# Patient Record
Sex: Female | Born: 1970 | Race: White | Hispanic: No | Marital: Married | Smoking: Never smoker
Health system: Southern US, Community
[De-identification: ages and names within clinical notes are randomized; demographics above are authoritative.]

## PROBLEM LIST (undated history)

## (undated) DIAGNOSIS — Z973 Presence of spectacles and contact lenses: Secondary | ICD-10-CM

## (undated) DIAGNOSIS — K219 Gastro-esophageal reflux disease without esophagitis: Secondary | ICD-10-CM

## (undated) DIAGNOSIS — M549 Dorsalgia, unspecified: Secondary | ICD-10-CM

## (undated) DIAGNOSIS — Z87442 Personal history of urinary calculi: Secondary | ICD-10-CM

## (undated) DIAGNOSIS — N200 Calculus of kidney: Secondary | ICD-10-CM

## (undated) DIAGNOSIS — K259 Gastric ulcer, unspecified as acute or chronic, without hemorrhage or perforation: Secondary | ICD-10-CM

## (undated) HISTORY — PX: NO PAST SURGERIES: SHX2092

---

## 1998-07-01 ENCOUNTER — Other Ambulatory Visit: Admission: RE | Admit: 1998-07-01 | Discharge: 1998-07-01 | Payer: Self-pay | Admitting: Obstetrics and Gynecology

## 1999-09-06 ENCOUNTER — Other Ambulatory Visit: Admission: RE | Admit: 1999-09-06 | Discharge: 1999-09-06 | Payer: Self-pay | Admitting: Obstetrics and Gynecology

## 2000-08-13 ENCOUNTER — Encounter: Payer: Self-pay | Admitting: Obstetrics and Gynecology

## 2000-08-13 ENCOUNTER — Ambulatory Visit (HOSPITAL_COMMUNITY): Admission: RE | Admit: 2000-08-13 | Discharge: 2000-08-13 | Payer: Self-pay | Admitting: Obstetrics and Gynecology

## 2000-09-13 ENCOUNTER — Other Ambulatory Visit: Admission: RE | Admit: 2000-09-13 | Discharge: 2000-09-13 | Payer: Self-pay | Admitting: Gynecology

## 2001-07-08 ENCOUNTER — Other Ambulatory Visit: Admission: RE | Admit: 2001-07-08 | Discharge: 2001-07-08 | Payer: Self-pay | Admitting: Obstetrics and Gynecology

## 2001-08-25 ENCOUNTER — Encounter: Payer: Self-pay | Admitting: Obstetrics and Gynecology

## 2001-08-25 ENCOUNTER — Ambulatory Visit (HOSPITAL_COMMUNITY): Admission: RE | Admit: 2001-08-25 | Discharge: 2001-08-25 | Payer: Self-pay | Admitting: Obstetrics and Gynecology

## 2002-01-18 ENCOUNTER — Inpatient Hospital Stay (HOSPITAL_COMMUNITY): Admission: AD | Admit: 2002-01-18 | Discharge: 2002-01-18 | Payer: Self-pay | Admitting: Obstetrics and Gynecology

## 2002-01-19 ENCOUNTER — Inpatient Hospital Stay (HOSPITAL_COMMUNITY): Admission: AD | Admit: 2002-01-19 | Discharge: 2002-01-21 | Payer: Self-pay | Admitting: Obstetrics and Gynecology

## 2002-07-21 ENCOUNTER — Other Ambulatory Visit: Admission: RE | Admit: 2002-07-21 | Discharge: 2002-07-21 | Payer: Self-pay | Admitting: Gynecology

## 2003-07-27 ENCOUNTER — Other Ambulatory Visit: Admission: RE | Admit: 2003-07-27 | Discharge: 2003-07-27 | Payer: Self-pay | Admitting: Gynecology

## 2004-07-31 ENCOUNTER — Other Ambulatory Visit: Admission: RE | Admit: 2004-07-31 | Discharge: 2004-07-31 | Payer: Self-pay | Admitting: Gynecology

## 2005-05-25 ENCOUNTER — Ambulatory Visit: Payer: Self-pay | Admitting: Gastroenterology

## 2005-06-22 ENCOUNTER — Ambulatory Visit: Payer: Self-pay | Admitting: Gastroenterology

## 2005-08-01 ENCOUNTER — Other Ambulatory Visit: Admission: RE | Admit: 2005-08-01 | Discharge: 2005-08-01 | Payer: Self-pay | Admitting: Gynecology

## 2006-09-30 ENCOUNTER — Inpatient Hospital Stay (HOSPITAL_COMMUNITY): Admission: AD | Admit: 2006-09-30 | Discharge: 2006-09-30 | Payer: Self-pay | Admitting: Obstetrics and Gynecology

## 2006-11-07 ENCOUNTER — Inpatient Hospital Stay (HOSPITAL_COMMUNITY): Admission: AD | Admit: 2006-11-07 | Discharge: 2006-11-09 | Payer: Self-pay | Admitting: Obstetrics and Gynecology

## 2008-01-04 ENCOUNTER — Emergency Department (HOSPITAL_COMMUNITY): Admission: EM | Admit: 2008-01-04 | Discharge: 2008-01-04 | Payer: Self-pay | Admitting: Emergency Medicine

## 2011-03-30 NOTE — Discharge Summary (Signed)
[facility]  Patient:    [name], [name] Visit Number: [id number] MRN: [id number]          Service Type: OBS Location: 910A [id number] [id number] Attending Physician:  Malon Kindle Dictated by:   Zenaida Niece, M.D. Admit Date:  01/19/2002 Discharge Date: 01/21/2002                             Discharge Summary  ADMISSION DIAGNOSIS:          Intrauterine pregnancy at 39 weeks.  DISCHARGE DIAGNOSES:          Intrauterine pregnancy at 39 weeks.  PROCEDURE:                    Spontaneous vaginal delivery.  COMPLICATIONS:                None.  CONSULTATIONS:                None.  HISTORY AND PHYSICAL:         This is a 40 year old white female, gravida 1, para 0, with an EG of 39 weeks with a due date of March 12 by an LMP consistent with a 9-week ultrasound.  She presents with regular contractions. She was scheduled for induction later on the day of admission.  When she presented she was not in active labor, but was uncomfortable with her contractions and requested analgesia.  She was admitted and given pain medication.  Prenatal care essentially uncomplicated.  PRENATAL LABS:                Blood type B positive, with negative antibody screen.  RPR nonreactive.  Rubella equivocal.  Hepatitis B surface antigen negative.  HIV negative.  Gonorrhea and chlamydia negative.  Triple screen normal.  Glucola 116.  Group B strep negative.  ALLERGIES:                    PENICILLIN, SULFA.  Intolerance to NSAIDs.  PAST MEDICAL HISTORY:         History of peptic ulcer disease.  PHYSICAL EXAMINATION:  GENERAL:                      She was afebrile with stable vital signs.  ABDOMEN:                      Fundal height 37 cm on January 13, 2002; soft and nontender.  Fetal heart tracing normal.  VAGINAL:                      (per the nurse) 2 cm dilated and 70% effaced.  HOSPITAL COURSE:              The patient was admitted with regular contractions  and uncomfortable; given IV Stadol.  Her contractions spaced out and she was started on Pitocin.  I then saw her on the morning of January 19, 2002 and she was 2-90-0; amniotomy revealed clear fluid.  She was continued on her Pitocin and progressed to complete and pushed well.  On the afternoon of January 19, 2002 she had a vaginal delivery of a viable female infant; with Apgars of 9 and 9 and weighing 6 pounds 9 ounces over a second-degree laceration.  Placenta delivered spontaneously and was intact.  Her second-degree laceration was repaired with 3-0 Vicryl.  Estimated blood loss was less than 500 cc.  A few hours after delivery she had an episode of left flank pain, associated with a temperature to 101.4.  A catheter UA was negative and she had no other fevers, and her pain resolved.  She remained afebrile the rest of her stay without complications.  On the morning of postpartum day #2 she was stable for discharge home.  DISCHARGE INSTRUCTIONS:  DIET:                         Regular.  ACTIVITY:                     Pelvic rest.  FOLLOW-UP:                    Four to six weeks.  MEDICATIONS:                  Percocet p.r.n. pain.  She is given discharge pamphlet. Dictated by:   Zenaida Niece, M.D. Attending Physician:  Malon Kindle DD:  01/21/02 TD:  01/23/02 Job: [id number]

## 2011-03-30 NOTE — Discharge Summary (Signed)
NAMEGUDELIA, EUGENE NO.:  [id number]   MEDICAL RECORD NO.:  [id number]          PATIENT TYPE:  INP   LOCATION:  9108                          FACILITY:  [facility]   PHYSICIAN:  Malachi Pro. Ambrose Mantle, M.D. DATE OF BIRTH:  September 03, 1971   DATE OF ADMISSION:  11/07/2006  DATE OF DISCHARGE:  11/09/2006                               DISCHARGE SUMMARY   This is a 40 year old white female para 1-0-0-1 gravida 2, estimated  gestational age 83+ weeks by 9-week ultrasound with [facility] November 19, 2005  presented complaining of regular contractions, no vaginal bleeding or  ruptured membranes. Evaluated in maternity admission unit. The cervix  changed from 3 cm to 4 cm over a couple of hours. Blood group and type  B+ with a negative antibody, RPR nonreactive, rubella immune, hepatitis  B surface antigen negative, GC and chlamydia negative.  First trimester  screen normal, AFP normal.  1-hour Glucola 134.  Group B strep negative.  Prenatal care showed the patient had a vanishing twin, marginal previa  that resolved.   OBSTETRIC HISTORY:  In 2003 the patient was spontaneous vaginal delivery  of 39 weeks delivered a 6 pounds 9 ounces infant, no complications.   PAST MEDICAL HISTORY:  Peptic ulcer disease and gastroesophageal reflux  disorder.  No surgical history.   ALLERGIES:  PENICILLIN and SULFA causes hives.   MEDICATIONS:  None.   PHYSICAL EXAM:  VITAL SIGNS:  The patient was afebrile with normal vital  signs.  Fetal heart tones were reactive.  Contractions every 3-5  minutes. Estimated fetal weight was about 7 pounds by the RN exam.  PELVIC:  The cervix was 4 cm, 85%, vertex at a minus two.   HOSPITAL COURSE:  At 11:15 a.m. the patient was comfortable with an  epidural.  Cervix was 5 cm, 80%, vertex at a -1.  Artificial rupture of  membranes produced clear fluid.  The patient progressed to complete  dilatation and pushed well.  She had a spontaneous vaginal delivery of a  living  female infant weight 6 pounds 5 ounces, Apgars of 8 at 1 and 9 at  5 minutes.  Placenta was spontaneous and intact.  Cord blood collection  was done, second-degree laceration repaired with 3-0 Vicryl.  Blood loss  less than 500 mL.  Postpartum the patient did well and was discharged on  the second postpartum day.  Initial hemoglobin was 11.3, hematocrit  33.5, white count 12,200, platelet count 253,000.  Follow-up hemoglobin  10.3, hematocrit 30.5, white count 12,500, platelet count 211,000.  RPR  was nonreactive.   FINAL DIAGNOSES:  Intrauterine pregnancy 38+ weeks, delivered vertex.   OPERATION:  Spontaneous delivery vertex, repair of second-degree  laceration.   FINAL CONDITION:  Improved.   INSTRUCTIONS:  Included our regular discharge instruction booklet.  Percocet 5/325 24 tablets one every 4-6 hours as needed for pain is  given at discharge and the patient is advised to return to the office in  6 weeks for follow-up examination.      Malachi Pro. Ambrose Mantle, M.D.  Electronically Signed     TFH/MEDQ  D:  11/09/2006  T:  11/09/2006  Job:  [id number]

## 2011-08-06 LAB — URINALYSIS, ROUTINE W REFLEX MICROSCOPIC
Bilirubin Urine: NEGATIVE
Glucose, UA: NEGATIVE
Ketones, ur: 15 — AB
Nitrite: NEGATIVE
Protein, ur: 30 — AB
Specific Gravity, Urine: 1.019
Urobilinogen, UA: 0.2
pH: 6.5

## 2011-08-06 LAB — URINE MICROSCOPIC-ADD ON

## 2012-02-21 ENCOUNTER — Other Ambulatory Visit: Payer: Self-pay | Admitting: Obstetrics and Gynecology

## 2012-02-21 DIAGNOSIS — N63 Unspecified lump in unspecified breast: Secondary | ICD-10-CM

## 2012-02-22 ENCOUNTER — Ambulatory Visit
Admission: RE | Admit: 2012-02-22 | Discharge: 2012-02-22 | Disposition: A | Discharge status: Home / Self Care | Payer: 59 | Source: Ambulatory Visit | Attending: Obstetrics and Gynecology | Admitting: Obstetrics and Gynecology

## 2012-02-22 DIAGNOSIS — N63 Unspecified lump in unspecified breast: Secondary | ICD-10-CM

## 2013-02-18 ENCOUNTER — Encounter (HOSPITAL_COMMUNITY): Payer: Self-pay

## 2013-02-18 ENCOUNTER — Emergency Department (HOSPITAL_COMMUNITY)
Admission: EM | Admit: 2013-02-18 | Discharge: 2013-02-18 | Disposition: A | Discharge status: Home / Self Care | Payer: PRIVATE HEALTH INSURANCE | Attending: Emergency Medicine | Admitting: Emergency Medicine

## 2013-02-18 DIAGNOSIS — Z79899 Other long term (current) drug therapy: Secondary | ICD-10-CM | POA: Insufficient documentation

## 2013-02-18 DIAGNOSIS — Z87442 Personal history of urinary calculi: Secondary | ICD-10-CM | POA: Insufficient documentation

## 2013-02-18 DIAGNOSIS — M5430 Sciatica, unspecified side: Secondary | ICD-10-CM | POA: Insufficient documentation

## 2013-02-18 DIAGNOSIS — K259 Gastric ulcer, unspecified as acute or chronic, without hemorrhage or perforation: Secondary | ICD-10-CM | POA: Insufficient documentation

## 2013-02-18 DIAGNOSIS — M545 Low back pain, unspecified: Secondary | ICD-10-CM | POA: Insufficient documentation

## 2013-02-18 DIAGNOSIS — M549 Dorsalgia, unspecified: Secondary | ICD-10-CM

## 2013-02-18 HISTORY — DX: Dorsalgia, unspecified: M54.9

## 2013-02-18 HISTORY — DX: Calculus of kidney: N20.0

## 2013-02-18 HISTORY — DX: Gastric ulcer, unspecified as acute or chronic, without hemorrhage or perforation: K25.9

## 2013-02-18 MED ORDER — HYDROMORPHONE HCL PF 1 MG/ML IJ SOLN
1.0000 mg | Freq: Once | INTRAMUSCULAR | Status: AC
  Administered 2013-02-18: 1 mg via INTRAVENOUS
  Filled 2013-02-18: qty 1

## 2013-02-18 MED ORDER — DIAZEPAM 5 MG/ML IJ SOLN
5.0000 mg | Freq: Once | INTRAMUSCULAR | Status: AC
  Administered 2013-02-18: 5 mg via INTRAVENOUS
  Filled 2013-02-18: qty 2

## 2013-02-18 MED ORDER — OXYCODONE HCL 5 MG PO TABS: 5.0000 mg | ORAL_TABLET | ORAL | Status: DC | PRN

## 2013-02-18 MED ORDER — KETOROLAC TROMETHAMINE 60 MG/2ML IM SOLN
60.0000 mg | Freq: Once | INTRAMUSCULAR | Status: AC
  Administered 2013-02-18: 60 mg via INTRAMUSCULAR
  Filled 2013-02-18: qty 2

## 2013-02-18 NOTE — ED Notes (Signed)
Reports pain to mid lower back that radiates down right leg. Denies any numbness or tingling to RLE but does report some tingling to right foot. Denies any bowel or bladder incontinence.   Patient was seen by orthopedic PA 1 week ago. X-rays negative. Rx prednisone, muscle relaxer and pain medication. Was told that if sx did not get better then they would do a MRI.   Patient has taken steroid and muscle relaxer but did not take the pain medication. States that "I didn't want it to hurt my stomach."

## 2013-02-18 NOTE — ED Notes (Signed)
PA at bedside.

## 2013-02-18 NOTE — ED Notes (Signed)
Patient presents with c/o lower back pain that radiates down right leg > 1 week. Was seen by PCP and dx sciatica. Completed prednisone taper Monday. Reports taking prescribed pain medication with no relief in sx

## 2013-02-18 NOTE — ED Notes (Signed)
Pt able to ambulate with limp. States she has shooting pain down her right leg with weightbearing.

## 2013-02-18 NOTE — ED Provider Notes (Signed)
History     CSN: [id number]  Arrival date & time 02/18/13  4098   First MD Initiated Contact with Patient 02/18/13 0602      Chief Complaint  Patient presents with  . Back Pain    (Consider location/radiation/quality/duration/timing/severity/associated sxs/prior treatment) HPI Comments: Patient is a 42 year old female who presents with sudden onset of lower back pain that started about 10 days ago. The pain started when she bent over to pick something up off the floor. The pain is sharp and severe and radiates down her right leg. The pain is constant. Movement makes the pain worse. Patient reports needing help ambulating due to pain which is not normal for her. Nothing makes the pain better. Patient was seen at [facility] for her pain 1 week ago where she was diagnosed with sciatica and given hydrocodone, prednisone, and robaxin. Patient did not take the hydrocodone because it causes stomach upset. Patient has tried Robaxin and prednisone taper for pain without relief. No associated symptoms. No saddles paresthesias or bladder/bowel incontinence.      Past Medical History  Diagnosis Date  . Stomach ulcer   . Kidney stone   . Back pain     History reviewed. No pertinent past surgical history.  No family history on file.  History  Substance Use Topics  . Smoking status: Never Smoker   . Smokeless tobacco: Never Used  . Alcohol Use: Yes     Comment: occasionally     OB History   Grav Para Term Preterm Abortions TAB SAB Ect Mult Living                  Review of Systems  Musculoskeletal: Positive for back pain.  All other systems reviewed and are negative.    Allergies  Penicillins and Sulfa antibiotics  Home Medications   Current Outpatient Rx  Name  Route  Sig  Dispense  Refill  . famotidine (PEPCID) 20 MG tablet   Oral   Take 20 mg by mouth 2 (two) times daily as needed for heartburn.         . methocarbamol (ROBAXIN) 500 MG tablet   Oral  Take 500 mg by mouth 4 (four) times daily as needed (muscle spasm).         . norethindrone (MICRONOR,CAMILA,ERRIN) 0.35 MG tablet   Oral   Take 1 tablet by mouth daily.           BP 110/68  Pulse 66  Temp(Src) 97.1 F (36.2 C) (Oral)  Resp 18  SpO2 96%  LMP 01/28/2013  Physical Exam  Nursing note and vitals reviewed. Constitutional: She is oriented to person, place, and time. She appears well-developed and well-nourished. No distress.  HENT:  Head: Normocephalic and atraumatic.  Eyes: Conjunctivae are normal.  Neck: Normal range of motion.  Cardiovascular: Normal rate and regular rhythm.  Exam reveals no gallop and no friction rub.   No murmur heard. Pulmonary/Chest: Effort normal and breath sounds normal. She has no wheezes. She has no rales. She exhibits no tenderness.  Abdominal: Soft. She exhibits no distension. There is no tenderness. There is no rebound.  Musculoskeletal: Normal range of motion.  Right gluteal tenderness to palpation. No obvious deformity or bruising noted. Full of ROM of right hip, knee and ankle. No midline spine tenderness to palpation. Patient has difficulty ambulating due to pain.    Neurological: She is alert and oriented to person, place, and time. Coordination normal.  Speech is  goal-oriented. Moves limbs without ataxia.   Skin: Skin is warm and dry.  Psychiatric: She has a normal mood and affect. Her behavior is normal.    ED Course  Procedures (including critical care time)  Labs Reviewed - No data to display No results found.   1. Back pain       MDM  6:17 AM Patient will have IM toradol and valium for pain. No bladder/bowel incontinence or saddle paresthesias. No signs of neurovascular compromise.   7:50 AM Patient reports minimal pain relief. I will give the patient dilaudid.   8:25 AM Patient reports pain relief with dilaudid. She is able to ambulate better with pain relief. I will discharge the patient with oxycodone  and instructions to follow up with [facility] for MRI and further evaluation and management. Patient instructed to return to the ED with worsening or concerning symptoms. Plan discussed with patient who is agreeable.      Emilia Beck, PA-C 02/18/13 0830

## 2013-02-18 NOTE — ED Provider Notes (Signed)
Medical screening examination/treatment/procedure(s) were performed by non-physician practitioner and as supervising physician I was immediately available for consultation/collaboration.   Dione Booze, MD 02/18/13 [phone]

## 2013-02-19 ENCOUNTER — Other Ambulatory Visit: Payer: Self-pay | Admitting: Surgical

## 2013-02-19 ENCOUNTER — Encounter (HOSPITAL_COMMUNITY): Payer: Self-pay | Admitting: *Deleted

## 2013-02-19 ENCOUNTER — Inpatient Hospital Stay (HOSPITAL_COMMUNITY): Payer: PRIVATE HEALTH INSURANCE

## 2013-02-19 ENCOUNTER — Observation Stay (HOSPITAL_COMMUNITY)
Admission: AD | Admit: 2013-02-19 | Discharge: 2013-02-21 | Disposition: A | Discharge status: Home / Self Care | Payer: PRIVATE HEALTH INSURANCE | Source: Ambulatory Visit | Attending: Orthopedic Surgery | Admitting: Orthopedic Surgery

## 2013-02-19 DIAGNOSIS — M5126 Other intervertebral disc displacement, lumbar region: Principal | ICD-10-CM | POA: Diagnosis present

## 2013-02-19 DIAGNOSIS — M48061 Spinal stenosis, lumbar region without neurogenic claudication: Secondary | ICD-10-CM | POA: Diagnosis present

## 2013-02-19 LAB — CBC WITH DIFFERENTIAL/PLATELET
Basophils Absolute: 0 10*3/uL (ref 0.0–0.1)
Basophils Relative: 0 % (ref 0–1)
Eosinophils Absolute: 0.1 10*3/uL (ref 0.0–0.7)
Eosinophils Relative: 1 % (ref 0–5)
HCT: 42.5 % (ref 36.0–46.0)
Hemoglobin: 14.5 g/dL (ref 12.0–15.0)
Lymphocytes Relative: 31 % (ref 12–46)
Lymphs Abs: 4.8 10*3/uL — ABNORMAL HIGH (ref 0.7–4.0)
MCH: 31.7 pg (ref 26.0–34.0)
MCHC: 34.1 g/dL (ref 30.0–36.0)
MCV: 92.8 fL (ref 78.0–100.0)
Monocytes Absolute: 1.4 10*3/uL — ABNORMAL HIGH (ref 0.1–1.0)
Monocytes Relative: 9 % (ref 3–12)
Neutro Abs: 9.1 10*3/uL — ABNORMAL HIGH (ref 1.7–7.7)
Neutrophils Relative %: 59 % (ref 43–77)
Platelets: 251 10*3/uL (ref 150–400)
RBC: 4.58 MIL/uL (ref 3.87–5.11)
RDW: 12.5 % (ref 11.5–15.5)
WBC: 15.4 10*3/uL — ABNORMAL HIGH (ref 4.0–10.5)

## 2013-02-19 LAB — COMPREHENSIVE METABOLIC PANEL
ALT: 11 U/L (ref 0–35)
AST: 14 U/L (ref 0–37)
Albumin: 3.4 g/dL — ABNORMAL LOW (ref 3.5–5.2)
Alkaline Phosphatase: 56 U/L (ref 39–117)
BUN: 10 mg/dL (ref 6–23)
CO2: 29 mEq/L (ref 19–32)
Calcium: 8.3 mg/dL — ABNORMAL LOW (ref 8.4–10.5)
Chloride: 98 mEq/L (ref 96–112)
Creatinine, Ser: 0.67 mg/dL (ref 0.50–1.10)
GFR calc Af Amer: >90 mL/min (ref >90)
GFR calc non Af Amer: >90 mL/min (ref >90)
Glucose, Bld: 109 mg/dL — ABNORMAL HIGH (ref 70–99)
Potassium: 3.8 mEq/L (ref 3.5–5.1)
Sodium: 135 mEq/L (ref 135–145)
Total Bilirubin: 0.3 mg/dL (ref 0.3–1.2)
Total Protein: 6.1 g/dL (ref 6.0–8.3)

## 2013-02-19 LAB — URINALYSIS, ROUTINE W REFLEX MICROSCOPIC
Bilirubin Urine: NEGATIVE
Glucose, UA: NEGATIVE mg/dL
Hgb urine dipstick: NEGATIVE
Ketones, ur: NEGATIVE mg/dL
Leukocytes, UA: NEGATIVE
Nitrite: NEGATIVE
Protein, ur: NEGATIVE mg/dL
Specific Gravity, Urine: 1.027 (ref 1.005–1.030)
Urobilinogen, UA: 0.2 mg/dL (ref 0.0–1.0)
pH: 5.5 (ref 5.0–8.0)

## 2013-02-19 LAB — PROTIME-INR
INR: 0.96 (ref 0.00–1.49)
Prothrombin Time: 12.7 seconds (ref 11.6–15.2)

## 2013-02-19 LAB — APTT: aPTT: 28 seconds (ref 24–37)

## 2013-02-19 MED ORDER — ONDANSETRON HCL 4 MG PO TABS
4.0000 mg | ORAL_TABLET | Freq: Four times a day (QID) | ORAL | Status: DC | PRN
  Administered 2013-02-19: 4 mg via ORAL
  Filled 2013-02-19: qty 1

## 2013-02-19 MED ORDER — FLEET ENEMA 7-19 GM/118ML RE ENEM: 1.0000 | ENEMA | Freq: Once | RECTAL | Status: AC | PRN

## 2013-02-19 MED ORDER — FAMOTIDINE 20 MG PO TABS
20.0000 mg | ORAL_TABLET | Freq: Two times a day (BID) | ORAL | Status: DC | PRN
  Filled 2013-02-19: qty 1

## 2013-02-19 MED ORDER — CLINDAMYCIN PHOSPHATE 900 MG/50ML IV SOLN
900.0000 mg | INTRAVENOUS | Status: AC
  Administered 2013-02-20: 900 mg via INTRAVENOUS
  Filled 2013-02-19: qty 50

## 2013-02-19 MED ORDER — NORETHINDRONE 0.35 MG PO TABS: 1.0000 | ORAL_TABLET | Freq: Every day | ORAL | Status: DC

## 2013-02-19 MED ORDER — SODIUM CHLORIDE 0.9 % IV SOLN
INTRAVENOUS | Status: DC
  Administered 2013-02-19 – 2013-02-20 (×2): via INTRAVENOUS

## 2013-02-19 MED ORDER — SODIUM CHLORIDE 0.9 % IV SOLN: 250.0000 mL | INTRAVENOUS | Status: DC | PRN

## 2013-02-19 MED ORDER — POLYETHYLENE GLYCOL 3350 17 G PO PACK: 17.0000 g | PACK | Freq: Every day | ORAL | Status: DC | PRN

## 2013-02-19 MED ORDER — SODIUM CHLORIDE 0.9 % IJ SOLN: 3.0000 mL | INTRAMUSCULAR | Status: DC | PRN

## 2013-02-19 MED ORDER — FAMOTIDINE 20 MG PO TABS
20.0000 mg | ORAL_TABLET | Freq: Two times a day (BID) | ORAL | Status: DC
  Administered 2013-02-19 – 2013-02-20 (×2): 20 mg via ORAL
  Filled 2013-02-19 (×3): qty 1

## 2013-02-19 MED ORDER — OXYCODONE HCL 5 MG PO TABS
5.0000 mg | ORAL_TABLET | ORAL | Status: DC | PRN
  Administered 2013-02-19 – 2013-02-20 (×2): 10 mg via ORAL
  Filled 2013-02-19 (×3): qty 2

## 2013-02-19 MED ORDER — SODIUM CHLORIDE 0.9 % IJ SOLN
3.0000 mL | Freq: Two times a day (BID) | INTRAMUSCULAR | Status: DC
  Administered 2013-02-19: 3 mL via INTRAVENOUS

## 2013-02-19 MED ORDER — BISACODYL 10 MG RE SUPP: 10.0000 mg | Freq: Every day | RECTAL | Status: DC | PRN

## 2013-02-19 MED ORDER — METHOCARBAMOL 500 MG PO TABS
500.0000 mg | ORAL_TABLET | Freq: Four times a day (QID) | ORAL | Status: DC | PRN
  Filled 2013-02-19: qty 1

## 2013-02-19 MED ORDER — ONDANSETRON HCL 4 MG/2ML IJ SOLN: 4.0000 mg | Freq: Four times a day (QID) | INTRAMUSCULAR | Status: DC | PRN

## 2013-02-19 MED ORDER — HYDROMORPHONE HCL PF 1 MG/ML IJ SOLN
1.0000 mg | INTRAMUSCULAR | Status: DC | PRN
  Administered 2013-02-19: 1 mg via INTRAVENOUS
  Filled 2013-02-19: qty 1

## 2013-02-19 MED ORDER — ACETAMINOPHEN 325 MG PO TABS
650.0000 mg | ORAL_TABLET | Freq: Four times a day (QID) | ORAL | Status: DC | PRN
  Administered 2013-02-20: 650 mg via ORAL
  Filled 2013-02-19: qty 2

## 2013-02-19 MED ORDER — ACETAMINOPHEN 650 MG RE SUPP: 650.0000 mg | Freq: Four times a day (QID) | RECTAL | Status: DC | PRN

## 2013-02-19 NOTE — H&P (Signed)
[name] is an 42 y.o. female.   Chief Complaint: back pain HPI: She actually was shoveling her driveway six weeks ago when we had the snow and she did injure her lower back on the right. She then subsequently noted some improvement and was doing fairly well until about a week ago when she started helping a friend move a desk and she felt pain shooting down her right posterior buttock and thigh down to just above the knee. Since that time, this pain has been fairly significant for her. She really has not noted a lot of improvement. She states she has mild groin pain on the right again but this has not been a significant issue. She overall has really no other radicular symptoms. No weakness in the lower extremities. She has been using Ibuprofen as needed as well as a heating pad only. She has not had any X-rays at this point in time. No real back issues in her past. She is mainly concerned about the fact this really does not seem to be alleviating or getting any better with treatments she has performed thus far. MRI revealed large herniated disc at L5-S1 on the right.   Past Medical History  Diagnosis Date  . Stomach ulcer   . Kidney stone   . Back pain    No pertinent surgical history  Social History:  reports that she has never smoked. She has never used smokeless tobacco. She reports that  drinks alcohol. She reports that she does not use illicit drugs.  Allergies:  Allergies  Allergen Reactions  . Penicillins Hives  . Sulfa Antibiotics Hives   Current outpatient prescriptions: famotidine (PEPCID) 20 MG tablet, Take 20 mg by mouth 2 (two) times daily as needed for heartburn., Disp: , Rfl: ;   methocarbamol (ROBAXIN) 500 MG tablet, Take 500 mg by mouth 4 (four) times daily as needed (muscle spasm)., Disp: , Rfl: ;   norethindrone (MICRONOR,CAMILA,ERRIN) 0.35 MG tablet, Take 1 tablet by mouth daily., Disp: , Rfl:  oxyCODONE (ROXICODONE) 5 MG immediate release tablet, Take 1 tablet (5 mg  total) by mouth every 4 (four) hours as needed for pain., Disp: 15 tablet, Rfl: 0  Review of Systems  Constitutional: Negative.   HENT: Negative.  Negative for neck pain.   Eyes: Negative.   Respiratory: Negative.   Cardiovascular: Negative.   Gastrointestinal: Negative.   Genitourinary: Negative.   Musculoskeletal: Positive for back pain. Negative for myalgias, joint pain and falls.  Skin: Negative.   Neurological: Negative.   Endo/Heme/Allergies: Negative.   Psychiatric/Behavioral: Negative.     Last menstrual period 01/28/2013. Physical Exam  Constitutional: She is oriented to person, place, and time. She appears well-developed and well-nourished. No distress.  HENT:  Head: Normocephalic and atraumatic.  Right Ear: External ear normal.  Left Ear: External ear normal.  Nose: Nose normal.  Mouth/Throat: Oropharynx is clear and moist.  Eyes: Conjunctivae and EOM are normal.  Neck: Normal range of motion. Neck supple. No tracheal deviation present. No thyromegaly present.  Cardiovascular: Normal rate, regular rhythm and intact distal pulses.   No murmur heard. Respiratory: Effort normal and breath sounds normal. No respiratory distress. She has no wheezes. She exhibits no tenderness.  GI: Soft. Bowel sounds are normal. She exhibits no distension and no mass. There is no tenderness.  Musculoskeletal:       Right hip: Normal.       Left hip: Normal.       Right knee: Normal.  Left knee: Normal.       Lumbar back: She exhibits decreased range of motion, tenderness, pain and spasm.       Right lower leg: She exhibits no tenderness and no swelling.       Left lower leg: She exhibits no tenderness and no swelling.  Lymphadenopathy:    She has no cervical adenopathy.  Neurological: She is alert and oriented to person, place, and time. No sensory deficit.  Complete neurological exam unable to be perform due to patient level of discomfort  Skin: No rash noted. She is not  diaphoretic. No erythema.  Psychiatric: She has a normal mood and affect. Her behavior is normal.     Assessment/Plan Lumbar disc herniation L5-S1, right She needs a lumbar microdiscectomy. She will be admitted overnight before the surgery for pain control. The possible complications of spinal surgery number one could be infection, which is extremely rare. We do use antibiotics prior to the surgery and during surgery and after surgery. Number two is always a slight degree of probability that you could develop a blood clot in your leg after any type of surgery and we try our best to prevent that with aspirin post op when it is safe to begin. The third is a dural leak. That is the spinal fluid leak that could occur. At certain rare times the bone or the disc could literally stick to the dura which is the lining which contains the spinal fluid and we could develop a small tear in that lining which we then patch up. That is an extremely rare complication. The last and final complication is a recurrent disc rupture. That means that you could rupture another small piece of disc later on down the road and there is about a 2% chance of that.      [address], PA-C  [place], Alp [place] LAUREN 02/19/2013, 5:52 PM

## 2013-02-20 ENCOUNTER — Inpatient Hospital Stay (HOSPITAL_COMMUNITY): Payer: PRIVATE HEALTH INSURANCE

## 2013-02-20 ENCOUNTER — Encounter (HOSPITAL_COMMUNITY)
Admission: AD | Disposition: A | Discharge status: Home / Self Care | Payer: Self-pay | Source: Ambulatory Visit | Attending: Orthopedic Surgery

## 2013-02-20 ENCOUNTER — Encounter (HOSPITAL_COMMUNITY): Payer: Self-pay | Admitting: Registered Nurse

## 2013-02-20 ENCOUNTER — Inpatient Hospital Stay (HOSPITAL_COMMUNITY): Payer: PRIVATE HEALTH INSURANCE | Admitting: Anesthesiology

## 2013-02-20 ENCOUNTER — Inpatient Hospital Stay: Admit: 2013-02-20 | Payer: Self-pay | Admitting: Orthopedic Surgery

## 2013-02-20 ENCOUNTER — Encounter (HOSPITAL_COMMUNITY): Payer: Self-pay | Admitting: Anesthesiology

## 2013-02-20 DIAGNOSIS — M5126 Other intervertebral disc displacement, lumbar region: Secondary | ICD-10-CM | POA: Diagnosis present

## 2013-02-20 DIAGNOSIS — M48061 Spinal stenosis, lumbar region without neurogenic claudication: Secondary | ICD-10-CM | POA: Diagnosis present

## 2013-02-20 HISTORY — PX: LUMBAR LAMINECTOMY: SHX95

## 2013-02-20 SURGERY — MICRODISCECTOMY LUMBAR LAMINECTOMY
Anesthesia: General | Site: Back | Laterality: Right | Wound class: Clean

## 2013-02-20 MED ORDER — METHOCARBAMOL 500 MG PO TABS: 500.0000 mg | ORAL_TABLET | Freq: Four times a day (QID) | ORAL | Status: DC | PRN

## 2013-02-20 MED ORDER — EPHEDRINE SULFATE 50 MG/ML IJ SOLN
INTRAMUSCULAR | Status: DC | PRN
  Administered 2013-02-20: 5 mg via INTRAVENOUS

## 2013-02-20 MED ORDER — ONDANSETRON HCL 4 MG/2ML IJ SOLN: 4.0000 mg | INTRAMUSCULAR | Status: DC | PRN

## 2013-02-20 MED ORDER — LACTATED RINGERS IV SOLN: INTRAVENOUS | Status: DC

## 2013-02-20 MED ORDER — ACETAMINOPHEN 10 MG/ML IV SOLN: 1000.0000 mg | Freq: Once | INTRAVENOUS | Status: DC | PRN

## 2013-02-20 MED ORDER — OXYCODONE-ACETAMINOPHEN 5-325 MG PO TABS
1.0000 | ORAL_TABLET | ORAL | Status: DC | PRN
  Administered 2013-02-21 (×2): 1 via ORAL
  Filled 2013-02-20 (×2): qty 1

## 2013-02-20 MED ORDER — HYDROMORPHONE HCL PF 1 MG/ML IJ SOLN: 0.5000 mg | INTRAMUSCULAR | Status: DC | PRN

## 2013-02-20 MED ORDER — METOCLOPRAMIDE HCL 5 MG/ML IJ SOLN
INTRAMUSCULAR | Status: DC | PRN
  Administered 2013-02-20: 5 mg via INTRAVENOUS

## 2013-02-20 MED ORDER — BISACODYL 10 MG RE SUPP: 10.0000 mg | Freq: Every day | RECTAL | Status: DC | PRN

## 2013-02-20 MED ORDER — DEXAMETHASONE SODIUM PHOSPHATE 10 MG/ML IJ SOLN
INTRAMUSCULAR | Status: DC | PRN
  Administered 2013-02-20: 10 mg via INTRAVENOUS

## 2013-02-20 MED ORDER — NEOSTIGMINE METHYLSULFATE 1 MG/ML IJ SOLN
INTRAMUSCULAR | Status: DC | PRN
  Administered 2013-02-20: 5 mg via INTRAVENOUS

## 2013-02-20 MED ORDER — LIDOCAINE HCL (CARDIAC) 20 MG/ML IV SOLN
INTRAVENOUS | Status: DC | PRN
  Administered 2013-02-20: 80 mg via INTRAVENOUS

## 2013-02-20 MED ORDER — POLYETHYLENE GLYCOL 3350 17 G PO PACK: 17.0000 g | PACK | Freq: Every day | ORAL | Status: DC | PRN

## 2013-02-20 MED ORDER — ROCURONIUM BROMIDE 100 MG/10ML IV SOLN
INTRAVENOUS | Status: DC | PRN
  Administered 2013-02-20: 5 mg via INTRAVENOUS
  Administered 2013-02-20: 40 mg via INTRAVENOUS
  Administered 2013-02-20: 5 mg via INTRAVENOUS
  Administered 2013-02-20: 10 mg via INTRAVENOUS

## 2013-02-20 MED ORDER — METHOCARBAMOL 500 MG PO TABS
500.0000 mg | ORAL_TABLET | Freq: Four times a day (QID) | ORAL | Status: DC | PRN
  Filled 2013-02-20 (×2): qty 1

## 2013-02-20 MED ORDER — HYDROCODONE-ACETAMINOPHEN 5-325 MG PO TABS: 1.0000 | ORAL_TABLET | ORAL | Status: DC | PRN

## 2013-02-20 MED ORDER — OXYCODONE HCL 5 MG PO TABS
5.0000 mg | ORAL_TABLET | ORAL | Status: DC | PRN
Start: 2013-02-20 — End: 2017-10-18

## 2013-02-20 MED ORDER — ACETAMINOPHEN 10 MG/ML IV SOLN
INTRAVENOUS | Status: DC | PRN
  Administered 2013-02-20: 1000 mg via INTRAVENOUS

## 2013-02-20 MED ORDER — MEPERIDINE HCL 50 MG/ML IJ SOLN: 6.2500 mg | INTRAMUSCULAR | Status: DC | PRN

## 2013-02-20 MED ORDER — GLYCOPYRROLATE 0.2 MG/ML IJ SOLN
INTRAMUSCULAR | Status: DC | PRN
  Administered 2013-02-20: .2 mg via INTRAVENOUS

## 2013-02-20 MED ORDER — MIDAZOLAM HCL 5 MG/5ML IJ SOLN
INTRAMUSCULAR | Status: DC | PRN
  Administered 2013-02-20 (×2): 1 mg via INTRAVENOUS

## 2013-02-20 MED ORDER — CLINDAMYCIN PHOSPHATE 600 MG/50ML IV SOLN
600.0000 mg | Freq: Three times a day (TID) | INTRAVENOUS | Status: DC
  Administered 2013-02-20 – 2013-02-21 (×2): 600 mg via INTRAVENOUS
  Filled 2013-02-20 (×3): qty 50

## 2013-02-20 MED ORDER — FENTANYL CITRATE 0.05 MG/ML IJ SOLN
INTRAMUSCULAR | Status: DC | PRN
  Administered 2013-02-20 (×4): 50 ug via INTRAVENOUS

## 2013-02-20 MED ORDER — STERILE WATER FOR IRRIGATION IR SOLN
Status: DC | PRN
  Administered 2013-02-20: 16:00:00

## 2013-02-20 MED ORDER — PHENOL 1.4 % MT LIQD
1.0000 | OROMUCOSAL | Status: DC | PRN
  Filled 2013-02-20: qty 177

## 2013-02-20 MED ORDER — DIAZEPAM 5 MG PO TABS
5.0000 mg | ORAL_TABLET | Freq: Three times a day (TID) | ORAL | Status: DC | PRN
  Administered 2013-02-20 (×2): 5 mg via ORAL
  Filled 2013-02-20 (×2): qty 1

## 2013-02-20 MED ORDER — BACITRACIN ZINC 500 UNIT/GM EX OINT
TOPICAL_OINTMENT | CUTANEOUS | Status: DC | PRN
  Administered 2013-02-20: 1 via TOPICAL

## 2013-02-20 MED ORDER — HYDROMORPHONE HCL PF 1 MG/ML IJ SOLN
0.2500 mg | INTRAMUSCULAR | Status: DC | PRN
  Administered 2013-02-20 (×4): 0.5 mg via INTRAVENOUS

## 2013-02-20 MED ORDER — PROPOFOL 10 MG/ML IV BOLUS
INTRAVENOUS | Status: DC | PRN
  Administered 2013-02-20: 20 mg via INTRAVENOUS
  Administered 2013-02-20: 180 mg via INTRAVENOUS

## 2013-02-20 MED ORDER — BUPIVACAINE LIPOSOME 1.3 % IJ SUSP
INTRAMUSCULAR | Status: DC | PRN
  Administered 2013-02-20: 20 mL

## 2013-02-20 MED ORDER — THROMBIN 5000 UNITS EX SOLR
OROMUCOSAL | Status: DC | PRN
  Administered 2013-02-20: 16:00:00 via TOPICAL

## 2013-02-20 MED ORDER — OXYCODONE HCL 5 MG PO TABS: 5.0000 mg | ORAL_TABLET | Freq: Once | ORAL | Status: DC | PRN

## 2013-02-20 MED ORDER — MORPHINE SULFATE 15 MG PO TABS
15.0000 mg | ORAL_TABLET | Freq: Once | ORAL | Status: AC
  Administered 2013-02-20: 15 mg via ORAL
  Filled 2013-02-20: qty 1

## 2013-02-20 MED ORDER — METHOCARBAMOL 100 MG/ML IJ SOLN: 500.0000 mg | Freq: Four times a day (QID) | INTRAVENOUS | Status: DC | PRN

## 2013-02-20 MED ORDER — BUPIVACAINE LIPOSOME 1.3 % IJ SUSP
20.0000 mL | Freq: Once | INTRAMUSCULAR | Status: DC
  Filled 2013-02-20 (×2): qty 20

## 2013-02-20 MED ORDER — MORPHINE SULFATE 2 MG/ML IJ SOLN
2.0000 mg | INTRAMUSCULAR | Status: DC | PRN
  Administered 2013-02-20 (×6): 2 mg via INTRAVENOUS
  Filled 2013-02-20 (×7): qty 1

## 2013-02-20 MED ORDER — MENTHOL 3 MG MT LOZG
1.0000 | LOZENGE | OROMUCOSAL | Status: DC | PRN
  Filled 2013-02-20: qty 9

## 2013-02-20 MED ORDER — LACTATED RINGERS IV SOLN
INTRAVENOUS | Status: DC | PRN
  Administered 2013-02-20 (×2): via INTRAVENOUS

## 2013-02-20 MED ORDER — ONDANSETRON HCL 4 MG/2ML IJ SOLN
INTRAMUSCULAR | Status: DC | PRN
  Administered 2013-02-20: 4 mg via INTRAVENOUS

## 2013-02-20 MED ORDER — FLEET ENEMA 7-19 GM/118ML RE ENEM: 1.0000 | ENEMA | Freq: Once | RECTAL | Status: AC | PRN

## 2013-02-20 MED ORDER — PROMETHAZINE HCL 25 MG/ML IJ SOLN: 6.2500 mg | INTRAMUSCULAR | Status: DC | PRN

## 2013-02-20 MED ORDER — OXYCODONE HCL 5 MG/5ML PO SOLN
5.0000 mg | Freq: Once | ORAL | Status: DC | PRN
  Filled 2013-02-20: qty 5

## 2013-02-20 SURGICAL SUPPLY — 50 items
APL SKNCLS STERI-STRIP NONHPOA (GAUZE/BANDAGES/DRESSINGS) ×1
BAG SPEC THK2 15X12 ZIP CLS (MISCELLANEOUS) ×1
BAG ZIPLOCK 12X15 (MISCELLANEOUS) ×2 IMPLANT
BENZOIN TINCTURE PRP APPL 2/3 (GAUZE/BANDAGES/DRESSINGS) ×2 IMPLANT
CLEANER TIP ELECTROSURG 2X2 (MISCELLANEOUS) ×3 IMPLANT
CLOTH BEACON ORANGE TIMEOUT ST (SAFETY) ×2 IMPLANT
CONT SPECI 4OZ STER CLIK (MISCELLANEOUS) ×2 IMPLANT
CORD HIGH FREQUENCY UNIPOLAR (ELECTROSURGICAL) ×1 IMPLANT
DRAIN PENROSE 18X1/4 LTX STRL (WOUND CARE) IMPLANT
DRAPE LG THREE QUARTER DISP (DRAPES) ×2 IMPLANT
DRAPE MICROSCOPE LEICA (MISCELLANEOUS) ×2 IMPLANT
DRAPE POUCH INSTRU U-SHP 10X18 (DRAPES) ×2 IMPLANT
DRAPE SURG 17X11 SM STRL (DRAPES) ×1 IMPLANT
DRSG ADAPTIC 3X8 NADH LF (GAUZE/BANDAGES/DRESSINGS) ×2 IMPLANT
DRSG EMULSION OIL 3X16 NADH (GAUZE/BANDAGES/DRESSINGS) ×1 IMPLANT
DRSG PAD ABDOMINAL 8X10 ST (GAUZE/BANDAGES/DRESSINGS) ×4 IMPLANT
DURAPREP 26ML APPLICATOR (WOUND CARE) ×2 IMPLANT
ELECT REM PT RETURN 9FT ADLT (ELECTROSURGICAL) ×4
ELECTRODE REM PT RTRN 9FT ADLT (ELECTROSURGICAL) ×1 IMPLANT
GLOVE BIOGEL PI IND STRL 8 (GLOVE) ×1 IMPLANT
GLOVE BIOGEL PI IND STRL 8.5 (GLOVE) ×1 IMPLANT
GLOVE BIOGEL PI INDICATOR 8 (GLOVE) ×1
GLOVE BIOGEL PI INDICATOR 8.5 (GLOVE) ×1
GLOVE ECLIPSE 8.0 STRL XLNG CF (GLOVE) ×4 IMPLANT
GOWN PREVENTION PLUS LG XLONG (DISPOSABLE) ×5 IMPLANT
GOWN STRL REIN XL XLG (GOWN DISPOSABLE) ×4 IMPLANT
KIT BASIN OR (CUSTOM PROCEDURE TRAY) ×2 IMPLANT
KIT POSITIONING SURG ANDREWS (MISCELLANEOUS) ×2 IMPLANT
MANIFOLD NEPTUNE II (INSTRUMENTS) ×2 IMPLANT
NDL SPNL 18GX3.5 QUINCKE PK (NEEDLE) ×2 IMPLANT
NEEDLE SPNL 18GX3.5 QUINCKE PK (NEEDLE) ×6 IMPLANT
NS IRRIG 1000ML POUR BTL (IV SOLUTION) ×2 IMPLANT
PATTIES SURGICAL .5 X.5 (GAUZE/BANDAGES/DRESSINGS) IMPLANT
PATTIES SURGICAL .75X.75 (GAUZE/BANDAGES/DRESSINGS) IMPLANT
PATTIES SURGICAL 1X1 (DISPOSABLE) IMPLANT
PIN SAFETY NICK PLATE  2 MED (MISCELLANEOUS)
PIN SAFETY NICK PLATE 2 MED (MISCELLANEOUS) IMPLANT
POSITIONER SURGICAL ARM (MISCELLANEOUS) ×2 IMPLANT
SPONGE GAUZE 4X4 12PLY (GAUZE/BANDAGES/DRESSINGS) ×1 IMPLANT
SPONGE LAP 4X18 X RAY DECT (DISPOSABLE) ×1 IMPLANT
SPONGE SURGIFOAM ABS GEL 100 (HEMOSTASIS) ×2 IMPLANT
STAPLER VISISTAT 35W (STAPLE) IMPLANT
SUT VIC AB 0 CT1 27 (SUTURE) ×2
SUT VIC AB 0 CT1 27XBRD ANTBC (SUTURE) ×1 IMPLANT
SUT VIC AB 1 CT1 27 (SUTURE) ×8
SUT VIC AB 1 CT1 27XBRD ANTBC (SUTURE) ×4 IMPLANT
TAPE CLOTH SURG 6X10 WHT LF (GAUZE/BANDAGES/DRESSINGS) ×1 IMPLANT
TOWEL OR 17X26 10 PK STRL BLUE (TOWEL DISPOSABLE) ×4 IMPLANT
TRAY LAMINECTOMY (CUSTOM PROCEDURE TRAY) ×2 IMPLANT
WATER STERILE IRR 1500ML POUR (IV SOLUTION) ×2 IMPLANT

## 2013-02-20 NOTE — Interval H&P Note (Signed)
History and Physical Interval Note:  02/20/2013 2:36 PM  [name]  has presented today for surgery, with the diagnosis of herniated disc L-5  S-1 right  The various methods of treatment have been discussed with the patient and family. After consideration of risks, benefits and other options for treatment, the patient has consented to  Procedure(s) with comments: MICRODISCECTOMY LUMBAR LAMINECTOMY (Right) - Dr. Shelle Iron will assist.  as a surgical intervention .  The patient's history has been reviewed, patient examined, no change in status, stable for surgery.  I have reviewed the patient's chart and labs.  Questions were answered to the patient's satisfaction.     [name] A

## 2013-02-20 NOTE — Anesthesia Preprocedure Evaluation (Addendum)
Anesthesia Evaluation  Patient identified by MRN, date of birth, ID band Patient awake    Reviewed: Allergy & Precautions, H&P , NPO status , Patient's Chart, lab work & pertinent test results  History of Anesthesia Complications (+) MALIGNANT HYPERTHERMIA  Airway Mallampati: I TM Distance: >3 FB Neck ROM: Full    Dental  (+) Dental Advisory Given and Teeth Intact   Pulmonary neg pulmonary ROS,  breath sounds clear to auscultation        Cardiovascular negative cardio ROS  Rhythm:Regular Rate:Normal     Neuro/Psych negative neurological ROS  negative psych ROS   GI/Hepatic Neg liver ROS, PUD,   Endo/Other  negative endocrine ROS  Renal/GU      Musculoskeletal negative musculoskeletal ROS (+)   Abdominal (+) - obese,   Peds  Hematology negative hematology ROS (+)   Anesthesia Other Findings   Reproductive/Obstetrics negative OB ROS                         Anesthesia Physical Anesthesia Plan  ASA: II  Anesthesia Plan: General   Post-op Pain Management:    Induction: Intravenous  Airway Management Planned: Oral ETT  Additional Equipment:   Intra-op Plan:   Post-operative Plan: Extubation in OR  Informed Consent: I have reviewed the patients History and Physical, chart, labs and discussed the procedure including the risks, benefits and alternatives for the proposed anesthesia with the patient or authorized representative who has indicated his/her understanding and acceptance.   Dental advisory given  Plan Discussed with: CRNA  Anesthesia Plan Comments:         Anesthesia Quick Evaluation

## 2013-02-20 NOTE — Care Management Note (Unsigned)
    Page 1 of 1   02/20/2013     3:04:45 PM   CARE MANAGEMENT NOTE 02/20/2013  Patient:  [name], [name]   Account Number:  [id number]  Date Initiated:  02/20/2013  Documentation initiated by:  Colleen Can  Subjective/Objective Assessment:   dx herniated disc with severe pain     Action/Plan:   Pt to have surgery. CM will f/u post surgery   Anticipated DC Date:  02/23/2013   Anticipated DC Plan:  HOME/SELF CARE         Choice offered to / List presented to:             Status of service:  In process, will continue to follow Medicare Important Message given?   (If response is "NO", the following Medicare IM given date fields will be blank) Date Medicare IM given:   Date Additional Medicare IM given:    Discharge Disposition:    Per UR Regulation:  Reviewed for med. necessity/level of care/duration of stay  If discussed at Long Length of Stay Meetings, dates discussed:    Comments:

## 2013-02-20 NOTE — Transfer of Care (Signed)
Immediate Anesthesia Transfer of Care Note  Patient: [name]  Procedure(s) Performed: Procedure(s) with comments: MICRODISCECTOMY LUMBAR LAMINECTOMY (Right) - Dr. Shelle Iron will assist.   Patient Location: PACU  Anesthesia Type:General  Level of Consciousness: awake, oriented, patient cooperative, lethargic and responds to stimulation  Airway & Oxygen Therapy: Patient Spontanous Breathing and Patient connected to face mask oxygen  Post-op Assessment: Report given to PACU RN, Post -op Vital signs reviewed and stable and Patient moving all extremities  Post vital signs: Reviewed and stable  Complications: No apparent anesthesia complications

## 2013-02-20 NOTE — Discharge Summary (Signed)
Physician Discharge Summary  Patient ID: Maui Ahart MRN: [id number] DOB/AGE: [birth year] 43 y.o.  Admit date: 02/19/2013 Discharge date: 02/20/2013  Admission Diagnoses:Herniated Lumbar Disc L-5-S-1  Discharge Diagnoses: Herniated Lumbar Disc L-5-S-1 on Right.Spinal Stenosis Active Problems:   Herniated lumbar intervertebral disc   Spinal stenosis of lumbar region   Discharged Condition: good  Hospital Course: No complications thus far.  Consults: None  Significant Diagnostic Studies: radiology: X-Ray: Localization Xrays in OR.  Treatments: therapies: PT  Discharge Exam: Blood pressure 127/83, pulse 99, temperature 97.5 F (36.4 C), temperature source Oral, resp. rate 14, height 5\' 2"  (1.575 m), weight 58.06 kg (128 lb), last menstrual period 01/28/2013, SpO2 100.00%. Extremities: Normal Neurological exam Post-Op  Disposition: 01-Home or Self Care  Discharge Orders   Future Orders Complete By Expires     Call MD / Call 911  As directed     Comments:      If you experience chest pain or shortness of breath, CALL 911 and be transported to the hospital emergency room.  If you develope a fever above 101 F, pus (white drainage) or increased drainage or redness at the wound, or calf pain, call your surgeon's office.    Constipation Prevention  As directed     Comments:      Drink plenty of fluids.  Prune juice may be helpful.  You may use a stool softener, such as Colace (over the counter) 100 mg twice a day.  Use MiraLax (over the counter) for constipation as needed.    Discharge instructions  As directed     Comments:      Change your dressing daily. Shower only, no tub bath. Call if any temperatures greater than 101 or any wound complications: [phone] during the day and ask for Dr. Jeannetta Ellis nurse, Mackey Birch[place].    Driving restrictions  As directed     Comments:      No driving for 2 weeks    Increase activity slowly as tolerated  As directed     Lifting restrictions   As directed     Comments:      No lifting        Medication List    STOP taking these medications       morphine 15 MG tablet  Commonly known as:  MSIR      TAKE these medications       famotidine 20 MG tablet  Commonly known as:  PEPCID  Take 20 mg by mouth 2 (two) times daily as needed for heartburn.     methocarbamol 500 MG tablet  Commonly known as:  ROBAXIN  Take 1 tablet (500 mg total) by mouth 4 (four) times daily as needed (muscle spasm).     norethindrone 0.35 MG tablet  Commonly known as:  MICRONOR,CAMILA,ERRIN  Take 1 tablet by mouth daily.     oxyCODONE 5 MG immediate release tablet  Commonly known as:  ROXICODONE  Take 1-2 tablets (5-10 mg total) by mouth every 4 (four) hours as needed for pain.         Signed: Daquane Aguilar A 02/20/2013, 9:23 PM

## 2013-02-20 NOTE — Anesthesia Postprocedure Evaluation (Signed)
Anesthesia Post Note  Patient: [name]  Procedure(s) Performed: Procedure(s) (LRB): MICRODISCECTOMY LUMBAR LAMINECTOMY (Right)  Anesthesia type: General  Patient location: PACU  Post pain: Pain level controlled  Post assessment: Post-op Vital signs reviewed  Last Vitals: BP 111/77  Pulse 100  Temp(Src) 36.7 C (Oral)  Resp 18  Ht 5\' 2"  (1.575 m)  Wt 128 lb (58.06 kg)  BMI 23.41 kg/m2  SpO2 100%  LMP 01/28/2013  Post vital signs: Reviewed  Level of consciousness: sedated  Complications: No apparent anesthesia complications

## 2013-02-20 NOTE — Progress Notes (Signed)
Subjective: She is tolerating the pain ok this morning. She is doing better on the valium and morphine. No major issues overnight. She feels that a heating pad help relief her discomfort some.     Objective: Vital signs in last 24 hours: Temp:  [98.5 F (36.9 C)-98.8 F (37.1 C)] 98.5 F (36.9 C) (04/10 2125) Pulse Rate:  [77-80] 77 (04/10 2125) Resp:  [16-18] 18 (04/10 2125) BP: (112-120)/(75-79) 120/79 mmHg (04/10 2125) SpO2:  [96 %] 96 % (04/10 2125) Weight:  [58.06 kg (128 lb)] 58.06 kg (128 lb) (04/10 1914)  Intake/Output from previous day: 04/10 0701 - 04/11 0700 In: 383.3 [P.O.:120; I.V.:263.3] Out: -    Recent Labs  02/19/13 1952  HGB 14.5    Recent Labs  02/19/13 1952  WBC 15.4*  RBC 4.58  HCT 42.5  PLT 251    Recent Labs  02/19/13 1952  NA 135  K 3.8  CL 98  CO2 29  BUN 10  CREATININE 0.67  GLUCOSE 109*  CALCIUM 8.3*    Recent Labs  02/19/13 1952  INR 0.96    Intact pulses distally Dorsiflexion/Plantar flexion intact  Assessment/Plan: She is scheduled for a lumbar laminectomy and microdiscectomy L5-S1 on the right this afternoon. She is NPO. We will start with therapy tomorrow. Heating pad ordered.    [name] LAUREN 02/20/2013, 7:26 AM      

## 2013-02-20 NOTE — Preoperative (Addendum)
Beta Blockers   Reason not to administer Beta Blockers:Not Applicable 

## 2013-02-20 NOTE — H&P (View-Only) (Signed)
Subjective: She is tolerating the pain ok this morning. She is doing better on the valium and morphine. No major issues overnight. She feels that a heating pad help relief her discomfort some.     Objective: Vital signs in last 24 hours: Temp:  [98.5 F (36.9 C)-98.8 F (37.1 C)] 98.5 F (36.9 C) (04/10 2125) Pulse Rate:  [77-80] 77 (04/10 2125) Resp:  [16-18] 18 (04/10 2125) BP: (112-120)/(75-79) 120/79 mmHg (04/10 2125) SpO2:  [96 %] 96 % (04/10 2125) Weight:  [58.06 kg (128 lb)] 58.06 kg (128 lb) (04/10 1914)  Intake/Output from previous day: 04/10 0701 - 04/11 0700 In: 383.3 [P.O.:120; I.V.:263.3] Out: -    Recent Labs  02/19/13 1952  HGB 14.5    Recent Labs  02/19/13 1952  WBC 15.4*  RBC 4.58  HCT 42.5  PLT 251    Recent Labs  02/19/13 1952  NA 135  K 3.8  CL 98  CO2 29  BUN 10  CREATININE 0.67  GLUCOSE 109*  CALCIUM 8.3*    Recent Labs  02/19/13 1952  INR 0.96    Intact pulses distally Dorsiflexion/Plantar flexion intact  Assessment/Plan: She is scheduled for a lumbar laminectomy and microdiscectomy L5-S1 on the right this afternoon. She is NPO. We will start with therapy tomorrow. Heating pad ordered.    Yuma Blucher LAUREN 02/20/2013, 7:26 AM

## 2013-02-20 NOTE — Brief Op Note (Signed)
02/19/2013 - 02/20/2013  5:59 PM  PATIENT:  [name]  42 y.o. female  PRE-OPERATIVE DIAGNOSIS:  herniated disc L-5  S-1 right,Axillary Disc  POST-OPERATIVE DIAGNOSIS:  herniated disc L-5  S-1 right,Axillary Disc and Recess Stenosis;VERY Complex Axillary Disc Herniation.  PROCEDURE:  Procedure(s) with comments: MICRODISCECTOMY LUMBAR LAMINECTOMY (Right) - Dr. Shelle Iron will assist. and Decompression for Spinal Stenosis.  SURGEON:  Surgeon(s) and Role:    * Jacki Cones, MD - Primary  PHYSICIAN ASSISTANT: Jene Every MD     ANESTHESIA:   general  EBL:  Total I/O In: 816.7 [I.V.:816.7] Out: -   BLOOD ADMINISTERED:none  DRAINS: none   LOCAL MEDICATIONS USED:  BUPIVICAINE 20cc.  SPECIMEN:  Source of Specimen:  L-5-S-1 Disc Fragment  DISPOSITION OF SPECIMEN:  PATHOLOGY  COUNTS:  YES  TOURNIQUET:  * No tourniquets in log *  DICTATION: .Other Dictation: Dictation Number [id number]  PLAN OF CARE: ADMIT  PATIENT DISPOSITION:  PACU - hemodynamically stable.   Delay start of Pharmacological VTE agent (>24hrs) due to surgical blood loss or risk of bleeding: yes

## 2013-02-21 NOTE — Evaluation (Signed)
Physical Therapy Evaluation Patient Details Name: [name] MRN: [id number] DOB: Oct 11, 1971 Today's Date: 02/21/2013 Time: 1191-4782 PT Time Calculation (min): 24 min  PT Assessment / Plan / Recommendation Clinical Impression  pt s/p microdiscectomy and presents with no further PT needs at this time. All education completed fromPT standpoint    PT Assessment  Patent does not need any further PT services    Follow Up Recommendations  No PT follow up    Does the patient have the potential to tolerate intense rehabilitation      Barriers to Discharge        Equipment Recommendations       Recommendations for Other Services     Frequency      Precautions / Restrictions Precautions Precautions: Back   Pertinent Vitals/Pain Pain controlled      Mobility  Bed Mobility Bed Mobility: Rolling Left;Left Sidelying to Sit Rolling Left: 5: Supervision Left Sidelying to Sit: 5: Supervision Details for Bed Mobility Assistance: cues for back precautions, log roll, increased time Transfers Transfers: Sit to Stand;Stand to Sit Sit to Stand: 5: Supervision Stand to Sit: 5: Supervision Details for Transfer Assistance: cues for back precautions and handplacement Ambulation/Gait Ambulation/Gait Assistance: 5: Supervision Ambulation Distance (Feet): 200 Feet Assistive device: None Ambulation/Gait Assistance Details: guarded, decreased UE swing Gait Pattern: Step-through pattern Stairs: Yes Stairs Assistance: 5: Supervision;4: Min guard Stairs Assistance Details (indicate cue type and reason): cues for sequence Number of Stairs: 4    Exercises     PT Diagnosis:    PT Problem List:   PT Treatment Interventions:     PT Goals    Visit Information  Last PT Received On: 02/21/13 Assistance Needed: +1    Subjective Data  Subjective: I am not too bad Patient Stated Goal: home   Prior Functioning  Home Living Lives With: Spouse Available Help at Discharge:  Family Type of Home: House Home Layout: Two level;Bed/bath upstairs Alternate Level Stairs-Number of Steps: 12 Alternate Level Stairs-Rails: Right Home Adaptive Equipment: None Prior Function Level of Independence: Independent (prior to back injury) Able to Take Stairs?: Yes Communication Communication: No difficulties    Cognition  Cognition Overall Cognitive Status: Appears within functional limits for tasks assessed/performed Arousal/Alertness: Awake/alert Orientation Level: Appears intact for tasks assessed Behavior During Session: [facility] for tasks performed    Extremity/Trunk Assessment Right Lower Extremity Assessment RLE ROM/Strength/Tone: [facility] for tasks assessed Left Lower Extremity Assessment LLE ROM/Strength/Tone: [facility] for tasks assessed   Balance    End of Session PT - End of Session Activity Tolerance: Patient tolerated treatment well Patient left: in chair;with call bell/phone within reach;with family/visitor present  GP Functional Assessment Tool Used: clinical judgement Functional Limitation: Mobility: Walking and moving around Mobility: Walking and Moving Around Current Status (N5621): At least 1 percent but less than 20 percent impaired, limited or restricted Mobility: Walking and Moving Around Goal Status [phone]): At least 1 percent but less than 20 percent impaired, limited or restricted Mobility: Walking and Moving Around Discharge Status [phone]): At least 1 percent but less than 20 percent impaired, limited or restricted   [facility] 02/21/2013, 9:46 AM

## 2013-02-21 NOTE — Progress Notes (Signed)
Subjective: 1 Day Post-Op Procedure(s) (LRB): MICRODISCECTOMY LUMBAR LAMINECTOMY (Right) Patient reports pain as 1 on 0-10 scale.    Objective: Vital signs in last 24 hours: Temp:  [97.5 F (36.4 C)-98.8 F (37.1 C)] 98.6 F (37 C) (04/12 9604) Pulse Rate:  [78-104] 83 (04/12 0613) Resp:  [14-18] 16 (04/12 0613) BP: (99-130)/(66-86) 113/74 mmHg (04/12 0613) SpO2:  [95 %-100 %] 100 % (04/12 5409) Weight:  [58.06 kg (128 lb)] 58.06 kg (128 lb) (04/11 0858)  Intake/Output from previous day: 04/11 0701 - 04/12 0700 In: 3006.7 [I.V.:3006.7] Out: 1900 [Urine:1900] Intake/Output this shift:     Recent Labs  02/19/13 1952  HGB 14.5    Recent Labs  02/19/13 1952  WBC 15.4*  RBC 4.58  HCT 42.5  PLT 251    Recent Labs  02/19/13 1952  NA 135  K 3.8  CL 98  CO2 29  BUN 10  CREATININE 0.67  GLUCOSE 109*  CALCIUM 8.3*    Recent Labs  02/19/13 1952  INR 0.96    Neurologically intact  Assessment/Plan: 1 Day Post-Op Procedure(s) (LRB): MICRODISCECTOMY LUMBAR LAMINECTOMY (Right) Up with therapy. Will DC today. Office 2-weeks.  Jolinda Pinkstaff A 02/21/2013, 7:43 AM

## 2013-02-21 NOTE — Care Management Note (Signed)
Cm spoke with patient with spouse and RN present at bedside. CM noted PT/OT recommendations of no further therapy follow-up. Pt states no DME needs required. Pt's spouse to assist in home care.    Roxy Manns Delphin Funes,RN,BSN [phone]

## 2013-02-21 NOTE — Evaluation (Signed)
Occupational Therapy Evaluation Patient Details Name: [name] MRN: [id number] DOB: 1971/05/18 Today's Date: 02/21/2013 Time: 6213-0865 OT Time Calculation (min): 18 min  OT Assessment / Plan / Recommendation Clinical Impression  Pt is a 42 year old female s/p back surgery and is doing well. Has husband available to assist and all education completed.    OT Assessment  Patient does not need any further OT services    Follow Up Recommendations  No OT follow up;Supervision - Intermittent    Barriers to Discharge      Equipment Recommendations  None recommended by OT    Recommendations for Other Services    Frequency       Precautions / Restrictions Precautions Precautions: Back        ADL  Eating/Feeding: Simulated;Independent Where Assessed - Eating/Feeding: Chair Grooming: Simulated;Supervision/safety Where Assessed - Grooming: Unsupported standing Upper Body Bathing: Simulated;Chest;Right arm;Left arm;Abdomen;Set up Where Assessed - Upper Body Bathing: Unsupported sitting Lower Body Bathing: Simulated;Minimal assistance Where Assessed - Lower Body Bathing: Supported sit to stand Upper Body Dressing: Simulated;Set up Where Assessed - Upper Body Dressing: Unsupported sitting Lower Body Dressing: Simulated;Moderate assistance Where Assessed - Lower Body Dressing: Supported sit to stand Toilet Transfer: Research scientist (life sciences) Method: Other (comment) (no device into bathroom) Acupuncturist: Comfort height toilet Toileting - Clothing Manipulation and Hygiene: Simulated;Supervision/safety Where Assessed - Engineer, mining and Hygiene: Standing Tub/Shower Transfer: Supervision/safety;Performed Equipment Used: Long-handled shoe horn;Long-handled sponge;Reacher;Sock aid ADL Comments: Pt able to state 2/3 back precautions. Reviewed all with pt and spouse. Pt currently unable to cross legs up for LB ADL but husband can  assist. Explained AE options and coverage if pt desires/needs. Reviewed handout with back care also. No further questions.     OT Diagnosis:    OT Problem List:   OT Treatment Interventions:     OT Goals    Visit Information  Last OT Received On: 02/21/13 Assistance Needed: +1    Subjective Data  Subjective: I am doing ok Patient Stated Goal: home when able   Prior Functioning     Home Living Lives With: Spouse Available Help at Discharge: Family Type of Home: House Home Layout: Two level;Bed/bath upstairs Alternate Level Stairs-Number of Steps: 12 Alternate Level Stairs-Rails: Right Bathroom Shower/Tub: Health visitor: Standard (vanity beside) Home Adaptive Equipment: None Prior Function Level of Independence: Independent (prior to back injury) Able to Take Stairs?: Yes Communication Communication: No difficulties         Vision/Perception     Cognition  Cognition Overall Cognitive Status: Appears within functional limits for tasks assessed/performed Arousal/Alertness: Awake/alert Orientation Level: Appears intact for tasks assessed Behavior During Session: [facility] for tasks performed    Extremity/Trunk Assessment Right Upper Extremity Assessment RUE ROM/Strength/Tone: [facility] for tasks assessed Left Upper Extremity Assessment LUE ROM/Strength/Tone: [facility] for tasks assessed Right Lower Extremity Assessment RLE ROM/Strength/Tone: [facility] for tasks assessed Left Lower Extremity Assessment LLE ROM/Strength/Tone: [facility] for tasks assessed     Mobility  Transfers Transfers: Sit to Stand;Stand to Sit Sit to Stand: 5: Supervision;With upper extremity assist;From chair/3-in-1;From toilet Stand to Sit: 5: Supervision;With upper extremity assist;To chair/3-in-1;To toilet Details for Transfer Assistance: min verbal cues     Exercise     Balance     End of Session OT - End of Session Activity Tolerance: Patient tolerated treatment well Patient left: in  chair;with call bell/phone within reach;with family/visitor present  GO Functional Assessment Tool Used: clinical judgement Functional Limitation: Self care Self Care  Current Status [phone]): At least 20 percent but less than 40 percent impaired, limited or restricted Self Care Goal Status (U0454): At least 20 percent but less than 40 percent impaired, limited or restricted Self Care Discharge Status [phone]): At least 20 percent but less than 40 percent impaired, limited or restricted   [name] [phone] 02/21/2013, 10:02 AM

## 2013-02-21 NOTE — Op Note (Signed)
NAMEAAYUSHI, [name] NO.:  [id number]  MEDICAL RECORD NO.:  [id number]  LOCATION:  1613                         FACILITY:  [facility]  PHYSICIAN:  Georges Lynch. Ragan Reale, M.D.DATE OF BIRTH:  [birth year]  DATE OF PROCEDURE:  02/20/2013 DATE OF DISCHARGE:                              OPERATIVE REPORT   PREOPERATIVE DIAGNOSIS: 1. Large herniated lumbar disk at L5-S1 on the right with right lower     extremity pain. 2. 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 3. 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  POSTOPERATIVE DIAGNOSIS: 1. Large herniated lumbar disk at L5-S1 on the right with right lower     extremity pain. 2. 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 3. 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  OPERATION: 1. Hemilaminectomy at L5-S1. 2. Decompression lateral recess at L5-S1 on the right. 3. Microdiskectomy at L5-S1 and excision of a large axillary herniated     disk.  SURGEON:  Georges Lynch. Darrelyn Hillock, M.D.  ASSISTANT:  Meredith Staggers, M.D.  PROCEDURE IN DETAIL:  Under general anesthesia, routine orthopedic prep and draping of the low back was carried out.  The patient was on a spinal frame.  Appropriate time-out was carried out prior to making incision, I also marked the appropriate right side of the back in the holding area.  At this time, 2 needles were placed in the back after the time-out.  X-ray was taken to verify our position, I then made an incision over L5-S1.  At this time, I went down and removed the muscle from the lamina spinous process at L5-S1.  At this point, another x-ray was taken to verify the position.  Following that, hemilaminectomy then was carried out in usual fashion.  Note, I extended the laminectomy distally to further expose the S1 root, which was severely compromised. Once we freed up the root in that area, we then went out and decompressed the lateral recess, and cauterized lateral recess veins. We then went up proximally as well  to complete our hemilaminectomy.  We then identified the disk space.  Cruciate incision was made in the disk space.  The microscope was brought in during the procedure.  At this time, we went down the disk space, decompressed the disk space, and decompressed the dura.  I then further explored the area, we had to remove more ligamentum flavum proximally.  I then noticed a large herniated disk fragment in the axilla of the S1 root.  This is extremely large and came out in one large piece.  The root now was totally free, the dura was free.  We were able to easily pass a hockey-stick out the foramina over the L5 root, and the S1 root.  The 2 roots were explored. We thoroughly irrigated out the area.  There was no other disk fragments noted.  We did go into the space and further decompressed the disk space as well.  I then irrigated the wound out loosely and applied some thrombin- soaked Gelfoam and closed the wound layers in usual fashion.  Note, the small distal deep and proximal deep part of the wound was left open for drainage purposes.  The subcu was closed with #1  Vicryl.  The skin was closed with metal staples.  Prior to closing the skin, I injected 20 mL of Exparel into the wound site.          ______________________________ Georges Lynch. Darrelyn Hillock, M.D.     RAG/MEDQ  D:  02/20/2013  T:  02/21/2013  Job:  [id number]

## 2013-02-21 NOTE — Progress Notes (Signed)
Pt to d/c home. No DME or home health needed. AVS reviewed and "My Chart" discussed with pt. Pt capable of verbalizing medications and follow-up appointments. Remains hemodynamically stable. No signs and symptoms of distress. Educated pt to return to ER in the case of SOB, dizziness, or chest pain.

## 2013-02-23 ENCOUNTER — Encounter (HOSPITAL_COMMUNITY): Payer: Self-pay | Admitting: Orthopedic Surgery

## 2013-09-03 ENCOUNTER — Other Ambulatory Visit: Payer: Self-pay

## 2013-09-03 DIAGNOSIS — Z1231 Encounter for screening mammogram for malignant neoplasm of breast: Secondary | ICD-10-CM

## 2013-10-12 ENCOUNTER — Ambulatory Visit
Admission: RE | Admit: 2013-10-12 | Discharge: 2013-10-12 | Disposition: A | Discharge status: Home / Self Care | Payer: PRIVATE HEALTH INSURANCE | Source: Ambulatory Visit

## 2013-10-12 DIAGNOSIS — Z1231 Encounter for screening mammogram for malignant neoplasm of breast: Secondary | ICD-10-CM

## 2014-05-04 IMAGING — CR DG LUMBAR SPINE 2-3V
2 series · 2 of 2 positions shown · non-contrast
Comparison: CT of the abdomen and pelvis performed 01/04/2008

CLINICAL DATA: Preoperative lumbar spine radiographs for lower back
surgery.

LUMBAR SPINE - 2-3 VIEW

[t l-spine a.p.]
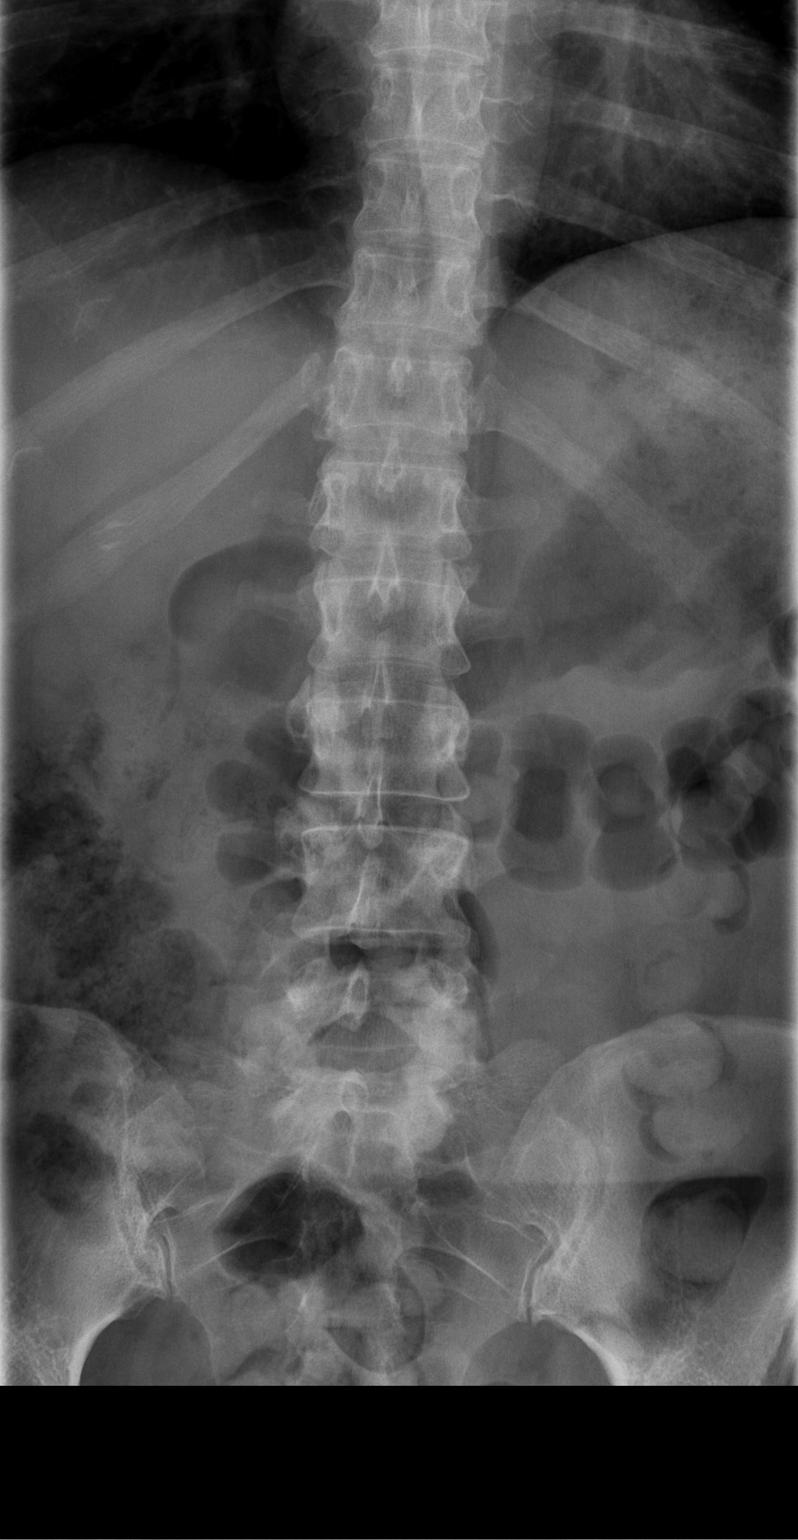

[t l-spine lat]
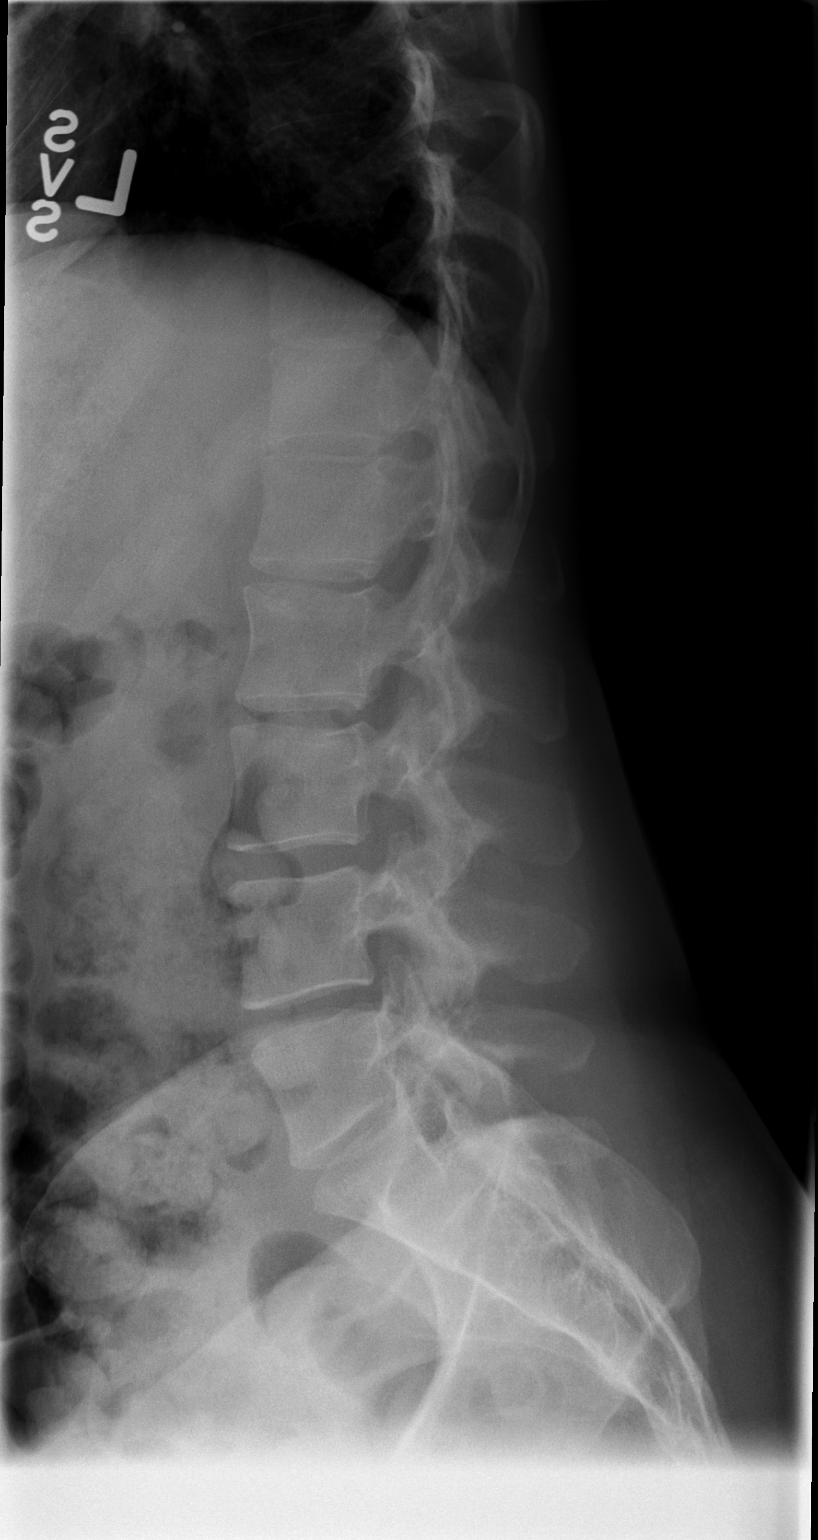

[2 of 2 positions shown; findings below may reference images not displayed]

FINDINGS: There is no evidence of fracture or subluxation.
Vertebral bodies demonstrate normal height and alignment.
Intervertebral disc spaces are preserved.

The visualized bowel gas pattern is unremarkable in appearance; air
and stool are noted within the colon.  The sacroiliac joints are
within normal limits.
IMPRESSION: No evidence of fracture or subluxation along the lumbar spine.

## 2014-05-05 IMAGING — CR DG SPINE 1V PORT
1 series · 1 of 1 positions shown · non-contrast
Comparison: Intraoperative film this same day at 7004 hours.

CLINICAL DATA: Intraoperative localization film in patient for
lower lumbar discectomy.

PORTABLE SPINE - 1 VIEW

[lateral]
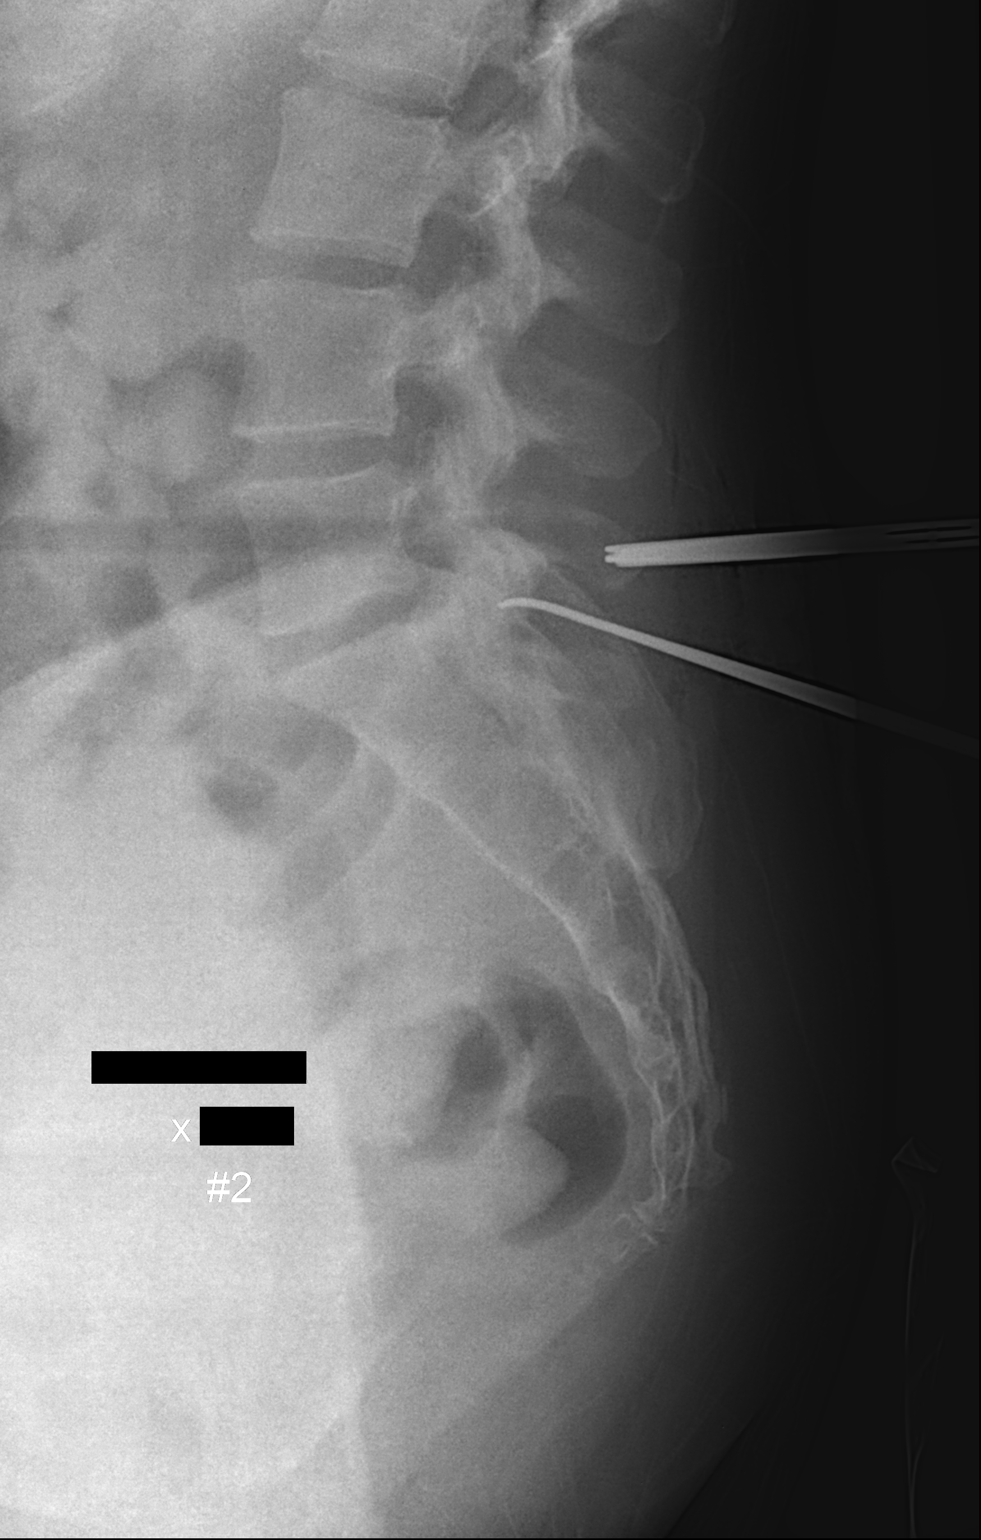

[1 of 1 positions shown; findings below may reference images not displayed]

FINDINGS: Probes are in place and directed toward the L5-S1 level.
IMPRESSION: L5-S1 localization.

## 2014-12-22 ENCOUNTER — Other Ambulatory Visit: Payer: Self-pay

## 2014-12-22 DIAGNOSIS — Z1231 Encounter for screening mammogram for malignant neoplasm of breast: Secondary | ICD-10-CM

## 2015-01-10 ENCOUNTER — Ambulatory Visit
Admission: RE | Admit: 2015-01-10 | Discharge: 2015-01-10 | Disposition: A | Discharge status: Home / Self Care | Payer: BLUE CROSS/BLUE SHIELD | Source: Ambulatory Visit

## 2015-01-10 DIAGNOSIS — Z1231 Encounter for screening mammogram for malignant neoplasm of breast: Secondary | ICD-10-CM

## 2016-02-13 ENCOUNTER — Other Ambulatory Visit: Payer: Self-pay

## 2016-02-13 DIAGNOSIS — Z1231 Encounter for screening mammogram for malignant neoplasm of breast: Secondary | ICD-10-CM

## 2016-02-27 ENCOUNTER — Ambulatory Visit
Admission: RE | Admit: 2016-02-27 | Discharge: 2016-02-27 | Disposition: A | Discharge status: Home / Self Care | Payer: BLUE CROSS/BLUE SHIELD | Source: Ambulatory Visit

## 2016-02-27 DIAGNOSIS — Z1231 Encounter for screening mammogram for malignant neoplasm of breast: Secondary | ICD-10-CM

## 2016-05-21 ENCOUNTER — Encounter: Payer: Self-pay | Admitting: Gastroenterology

## 2017-10-18 ENCOUNTER — Inpatient Hospital Stay (HOSPITAL_COMMUNITY): Payer: BLUE CROSS/BLUE SHIELD

## 2017-10-18 ENCOUNTER — Ambulatory Visit: Payer: Self-pay | Admitting: Surgical

## 2017-10-18 ENCOUNTER — Inpatient Hospital Stay (HOSPITAL_COMMUNITY)
Admission: AD | Admit: 2017-10-18 | Discharge: 2017-10-22 | DRG: 520 | Disposition: A | Discharge status: Home / Self Care | Payer: BLUE CROSS/BLUE SHIELD | Source: Ambulatory Visit | Attending: Orthopedic Surgery | Admitting: Orthopedic Surgery

## 2017-10-18 ENCOUNTER — Other Ambulatory Visit: Payer: Self-pay

## 2017-10-18 ENCOUNTER — Other Ambulatory Visit: Payer: Self-pay | Admitting: Surgical

## 2017-10-18 ENCOUNTER — Other Ambulatory Visit: Payer: Self-pay | Admitting: General Practice

## 2017-10-18 ENCOUNTER — Encounter (HOSPITAL_COMMUNITY): Payer: Self-pay

## 2017-10-18 DIAGNOSIS — M48061 Spinal stenosis, lumbar region without neurogenic claudication: Principal | ICD-10-CM | POA: Diagnosis present

## 2017-10-18 DIAGNOSIS — Z882 Allergy status to sulfonamides status: Secondary | ICD-10-CM | POA: Diagnosis not present

## 2017-10-18 DIAGNOSIS — Z885 Allergy status to narcotic agent status: Secondary | ICD-10-CM

## 2017-10-18 DIAGNOSIS — M5116 Intervertebral disc disorders with radiculopathy, lumbar region: Secondary | ICD-10-CM | POA: Insufficient documentation

## 2017-10-18 DIAGNOSIS — Z880 Allergy status to penicillin: Secondary | ICD-10-CM | POA: Diagnosis not present

## 2017-10-18 DIAGNOSIS — Z881 Allergy status to other antibiotic agents status: Secondary | ICD-10-CM | POA: Diagnosis not present

## 2017-10-18 DIAGNOSIS — Z419 Encounter for procedure for purposes other than remedying health state, unspecified: Secondary | ICD-10-CM

## 2017-10-18 DIAGNOSIS — M4807 Spinal stenosis, lumbosacral region: Secondary | ICD-10-CM | POA: Diagnosis present

## 2017-10-18 LAB — COMPREHENSIVE METABOLIC PANEL
ALT: 17 U/L (ref 14–54)
AST: 17 U/L (ref 15–41)
Albumin: 3.9 g/dL (ref 3.5–5.0)
Alkaline Phosphatase: 49 U/L (ref 38–126)
Anion gap: 7 (ref 5–15)
BUN: 16 mg/dL (ref 6–20)
CO2: 27 mmol/L (ref 22–32)
Calcium: 8.4 mg/dL — ABNORMAL LOW (ref 8.9–10.3)
Chloride: 104 mmol/L (ref 101–111)
Creatinine, Ser: 0.66 mg/dL (ref 0.44–1.00)
GFR calc Af Amer: >60 mL/min (ref >60)
GFR calc non Af Amer: >60 mL/min (ref >60)
Glucose, Bld: 83 mg/dL (ref 65–99)
Potassium: 3.5 mmol/L (ref 3.5–5.1)
Sodium: 138 mmol/L (ref 135–145)
Total Bilirubin: 0.9 mg/dL (ref 0.3–1.2)
Total Protein: 6.9 g/dL (ref 6.5–8.1)

## 2017-10-18 LAB — CBC WITH DIFFERENTIAL/PLATELET
Basophils Absolute: 0 10*3/uL (ref 0.0–0.1)
Basophils Relative: 0 %
Eosinophils Absolute: 0.1 10*3/uL (ref 0.0–0.7)
Eosinophils Relative: 0 %
HCT: 45 % (ref 36.0–46.0)
Hemoglobin: 15.4 g/dL — ABNORMAL HIGH (ref 12.0–15.0)
Lymphocytes Relative: 31 %
Lymphs Abs: 4.1 10*3/uL — ABNORMAL HIGH (ref 0.7–4.0)
MCH: 31.8 pg (ref 26.0–34.0)
MCHC: 34.2 g/dL (ref 30.0–36.0)
MCV: 92.8 fL (ref 78.0–100.0)
Monocytes Absolute: 1.2 10*3/uL — ABNORMAL HIGH (ref 0.1–1.0)
Monocytes Relative: 9 %
Neutro Abs: 7.9 10*3/uL — ABNORMAL HIGH (ref 1.7–7.7)
Neutrophils Relative %: 60 %
Platelets: 258 10*3/uL (ref 150–400)
RBC: 4.85 MIL/uL (ref 3.87–5.11)
RDW: 12.4 % (ref 11.5–15.5)
WBC: 13.2 10*3/uL — ABNORMAL HIGH (ref 4.0–10.5)

## 2017-10-18 LAB — PROTIME-INR
INR: 0.91
Prothrombin Time: 12.2 seconds (ref 11.4–15.2)

## 2017-10-18 LAB — APTT: aPTT: 25 seconds (ref 24–36)

## 2017-10-18 MED ORDER — ACETAMINOPHEN 650 MG RE SUPP: 650.0000 mg | Freq: Four times a day (QID) | RECTAL | Status: DC | PRN

## 2017-10-18 MED ORDER — SODIUM CHLORIDE 0.9 % IV SOLN: INTRAVENOUS | Status: DC

## 2017-10-18 MED ORDER — SODIUM CHLORIDE 0.9 % IV SOLN
INTRAVENOUS | Status: DC
  Administered 2017-10-18 – 2017-10-21 (×6): via INTRAVENOUS

## 2017-10-18 MED ORDER — DOCUSATE SODIUM 50 MG PO CAPS: 100.0000 mg | ORAL_CAPSULE | Freq: Two times a day (BID) | ORAL | Status: DC

## 2017-10-18 MED ORDER — SODIUM CHLORIDE 0.9 % IV SOLN: 250.0000 mL | INTRAVENOUS | Status: DC | PRN

## 2017-10-18 MED ORDER — DOCUSATE SODIUM 100 MG PO CAPS
100.0000 mg | ORAL_CAPSULE | Freq: Two times a day (BID) | ORAL | Status: DC
  Administered 2017-10-18 – 2017-10-21 (×7): 100 mg via ORAL
  Filled 2017-10-18 (×8): qty 1

## 2017-10-18 MED ORDER — HYDROMORPHONE HCL 2 MG/ML IJ SOLN: 0.5000 mg | INTRAMUSCULAR | Status: DC | PRN

## 2017-10-18 MED ORDER — PREDNISONE 10 MG PO TABS
10.0000 mg | ORAL_TABLET | Freq: Two times a day (BID) | ORAL | Status: DC
  Administered 2017-10-18 – 2017-10-22 (×7): 10 mg via ORAL
  Filled 2017-10-18: qty 1
  Filled 2017-10-18 (×2): qty 2
  Filled 2017-10-18: qty 1
  Filled 2017-10-18 (×3): qty 2

## 2017-10-18 MED ORDER — BISACODYL 10 MG RE SUPP: 10.0000 mg | Freq: Every day | RECTAL | Status: DC | PRN

## 2017-10-18 MED ORDER — POLYETHYLENE GLYCOL 3350 17 G PO PACK: 17.0000 g | PACK | Freq: Every day | ORAL | Status: DC | PRN

## 2017-10-18 MED ORDER — ACETAMINOPHEN 325 MG PO TABS
650.0000 mg | ORAL_TABLET | Freq: Four times a day (QID) | ORAL | Status: DC | PRN
  Administered 2017-10-19: 09:00:00 650 mg via ORAL
  Filled 2017-10-18: qty 2

## 2017-10-18 MED ORDER — MORPHINE SULFATE (PF) 4 MG/ML IV SOLN: 1.0000 mg | INTRAVENOUS | Status: DC | PRN

## 2017-10-18 MED ORDER — SODIUM CHLORIDE 0.9% FLUSH: 3.0000 mL | Freq: Two times a day (BID) | INTRAVENOUS | Status: DC

## 2017-10-18 MED ORDER — NORETHINDRONE 0.35 MG PO TABS
1.0000 | ORAL_TABLET | Freq: Every day | ORAL | Status: DC
  Administered 2017-10-19 – 2017-10-20 (×2): 0.35 mg via ORAL

## 2017-10-18 MED ORDER — HYDROMORPHONE HCL 1 MG/ML IJ SOLN: 0.5000 mg | INTRAMUSCULAR | Status: DC | PRN

## 2017-10-18 MED ORDER — OXYCODONE HCL 5 MG PO TABS: 5.0000 mg | ORAL_TABLET | ORAL | Status: DC | PRN

## 2017-10-18 MED ORDER — BISACODYL 10 MG RE SUPP
10.0000 mg | Freq: Every day | RECTAL | Status: DC | PRN
  Administered 2017-10-20: 15:00:00 10 mg via RECTAL
  Filled 2017-10-18: qty 1

## 2017-10-18 MED ORDER — ALBUTEROL SULFATE (2.5 MG/3ML) 0.083% IN NEBU: 2.5000 mg | INHALATION_SOLUTION | Freq: Four times a day (QID) | RESPIRATORY_TRACT | Status: DC | PRN

## 2017-10-18 MED ORDER — ONDANSETRON HCL 4 MG/2ML IJ SOLN: 4.0000 mg | Freq: Four times a day (QID) | INTRAMUSCULAR | Status: DC | PRN

## 2017-10-18 MED ORDER — ALBUTEROL SULFATE HFA 108 (90 BASE) MCG/ACT IN AERS: 1.0000 | INHALATION_SPRAY | Freq: Four times a day (QID) | RESPIRATORY_TRACT | Status: DC | PRN

## 2017-10-18 MED ORDER — ONDANSETRON HCL 4 MG PO TABS: 4.0000 mg | ORAL_TABLET | Freq: Four times a day (QID) | ORAL | Status: DC | PRN

## 2017-10-18 MED ORDER — METHOCARBAMOL 500 MG PO TABS
500.0000 mg | ORAL_TABLET | Freq: Three times a day (TID) | ORAL | Status: DC | PRN
  Administered 2017-10-18: 18:00:00 500 mg via ORAL
  Filled 2017-10-18 (×2): qty 1

## 2017-10-18 MED ORDER — PREDNISONE 5 MG PO TABS: 10.0000 mg | ORAL_TABLET | Freq: Two times a day (BID) | ORAL | Status: DC

## 2017-10-18 MED ORDER — SODIUM CHLORIDE 0.9% FLUSH: 3.0000 mL | INTRAVENOUS | Status: DC | PRN

## 2017-10-18 MED ORDER — VANCOMYCIN HCL IN DEXTROSE 1-5 GM/200ML-% IV SOLN
1000.0000 mg | INTRAVENOUS | Status: AC
  Administered 2017-10-21: 1000 mg via INTRAVENOUS
  Filled 2017-10-18: qty 200

## 2017-10-18 MED ORDER — FLEET ENEMA 7-19 GM/118ML RE ENEM: 1.0000 | ENEMA | Freq: Once | RECTAL | Status: DC | PRN

## 2017-10-18 MED ORDER — ACETAMINOPHEN 325 MG PO TABS: 650.0000 mg | ORAL_TABLET | Freq: Four times a day (QID) | ORAL | Status: DC | PRN

## 2017-10-18 MED ORDER — CHLORHEXIDINE GLUCONATE 4 % EX LIQD
60.0000 mL | Freq: Once | CUTANEOUS | Status: DC
  Filled 2017-10-18: qty 60

## 2017-10-18 MED ORDER — VANCOMYCIN HCL IN DEXTROSE 1-5 GM/200ML-% IV SOLN
1000.0000 mg | INTRAVENOUS | Status: DC
  Filled 2017-10-18: qty 200

## 2017-10-18 MED ORDER — SODIUM CHLORIDE 0.9 % IV SOLN: 4.0000 mg | Freq: Four times a day (QID) | INTRAVENOUS | Status: DC | PRN

## 2017-10-18 MED ORDER — SODIUM CHLORIDE 0.9% FLUSH
3.0000 mL | Freq: Two times a day (BID) | INTRAVENOUS | Status: DC
  Administered 2017-10-18 – 2017-10-21 (×2): 3 mL via INTRAVENOUS

## 2017-10-18 MED ORDER — HYDROCODONE-ACETAMINOPHEN 5-325 MG PO TABS: 1.0000 | ORAL_TABLET | Freq: Four times a day (QID) | ORAL | Status: DC | PRN

## 2017-10-18 MED ORDER — METHOCARBAMOL 1000 MG/10ML IJ SOLN
500.0000 mg | Freq: Three times a day (TID) | INTRAVENOUS | Status: DC
  Administered 2017-10-19 – 2017-10-22 (×8): 500 mg via INTRAVENOUS
  Filled 2017-10-18: qty 5
  Filled 2017-10-18 (×2): qty 550
  Filled 2017-10-18: qty 5
  Filled 2017-10-18 (×3): qty 550
  Filled 2017-10-18: qty 5
  Filled 2017-10-18 (×2): qty 550
  Filled 2017-10-18: qty 5
  Filled 2017-10-18: qty 550
  Filled 2017-10-18: qty 5

## 2017-10-18 MED ORDER — POVIDONE-IODINE 10 % EX SWAB: 2.0000 "application " | Freq: Once | CUTANEOUS | Status: DC

## 2017-10-18 MED ORDER — HYDROCODONE-ACETAMINOPHEN 10-325 MG PO TABS
1.0000 | ORAL_TABLET | ORAL | Status: DC | PRN
  Administered 2017-10-18 – 2017-10-21 (×13): 1 via ORAL
  Filled 2017-10-18 (×16): qty 1

## 2017-10-18 MED ORDER — OXYCODONE HCL 5 MG PO TABS
5.0000 mg | ORAL_TABLET | ORAL | Status: DC | PRN
  Filled 2017-10-18: qty 1

## 2017-10-18 MED ORDER — HYDROCODONE-ACETAMINOPHEN 5-325 MG PO TABS: 1.0000 | ORAL_TABLET | ORAL | Status: DC | PRN

## 2017-10-18 NOTE — H&P (Signed)
[name] is an 46 y.o. female.   Chief Complaint: low back pain HPI: [name] presented to the office about a week ago with the chief complaint of worsening low back pain over the past 2 weeks. No injury that she can recall. She reported pain in the low back as well as in the right leg. She has a history of a disc rupture at L5-S1 on the right in 2014. She has done well until this recent increase in pain. MRI was ordered and showed a large recurrent disc rupture at L5-S1 on the right. She was unable to control pain and perform ADLs at home. She comes in requiring admission for pain management with plan for surgery next week.   Past Medical History:  Diagnosis Date  . Back pain   . Kidney stone   . Stomach ulcer     Past Surgical History:  Procedure Laterality Date  . LUMBAR LAMINECTOMY Right 02/20/2013   Procedure: MICRODISCECTOMY LUMBAR LAMINECTOMY;  Surgeon: Tobi Bastos, MD;  Location: [facility];  Service: Orthopedics;  Laterality: Right;  Dr. Tonita Cong will assist.   . NO PAST SURGERIES      No family history on file. Social History:  reports that  has never smoked. she has never used smokeless tobacco. She reports that she drinks alcohol. She reports that she does not use drugs.  Allergies:  Allergies  Allergen Reactions  . Amoxicillin Hives    Has patient had a PCN reaction causing immediate rash, facial/tongue/throat swelling, SOB or lightheadedness with hypotension: Unknown Has patient had a PCN reaction causing severe rash involving mucus membranes or skin necrosis: Yes Has patient had a PCN reaction that required hospitalization: No Has patient had a PCN reaction occurring within the last 10 years: No If all of the above answers are "NO", then may proceed with Cephalosporin use.   Marland Kitchen Penicillins Hives    Has patient had a PCN reaction causing immediate rash, facial/tongue/throat swelling, SOB or lightheadedness with hypotension: Unknown Has patient had a PCN reaction  causing severe rash involving mucus membranes or skin necrosis: Yes Has patient had a PCN reaction that required hospitalization: No Has patient had a PCN reaction occurring within the last 10 years: No If all of the above answers are "NO", then may proceed with Cephalosporin use.   . Sulfa Antibiotics Hives    Medications Prior to Admission  Medication Sig Dispense Refill  . acetaminophen (TYLENOL) 500 MG tablet Take 1,000 mg by mouth every 6 (six) hours as needed for mild pain, moderate pain, fever or headache.    . albuterol (PROVENTIL HFA;VENTOLIN HFA) 108 (90 Base) MCG/ACT inhaler Inhale 1-2 puffs into the lungs every 6 (six) hours as needed for wheezing or shortness of breath.    Marland Kitchen HYDROcodone-acetaminophen (NORCO/VICODIN) 5-325 MG tablet Take 1 tablet by mouth every 6 (six) hours as needed for moderate pain.    Marland Kitchen ibuprofen (ADVIL,MOTRIN) 200 MG tablet Take 200-400 mg by mouth every 6 (six) hours as needed for fever, headache, mild pain, moderate pain or cramping.    . norethindrone (CAMILA) 0.35 MG tablet Take 1 tablet by mouth daily.    . [DISCONTINUED] predniSONE (DELTASONE) 10 MG tablet Take 10 mg by mouth See admin instructions. Started 12/03 for 12 days Takes 10 mg 3 times day for 2 days, then 10 mg twice a day for 10 days      Results for orders placed or performed during the hospital encounter of 10/18/17 (from the past  48 hour(s))  Comprehensive metabolic panel     Status: Abnormal   Collection Time: 10/18/17 12:12 PM  Result Value Ref Range   Sodium 138 135 - 145 mmol/L   Potassium 3.5 3.5 - 5.1 mmol/L   Chloride 104 101 - 111 mmol/L   CO2 27 22 - 32 mmol/L   Glucose, Bld 83 65 - 99 mg/dL   BUN 16 6 - 20 mg/dL   Creatinine, Ser 0.66 0.44 - 1.00 mg/dL   Calcium 8.4 (L) 8.9 - 10.3 mg/dL   Total Protein 6.9 6.5 - 8.1 g/dL   Albumin 3.9 3.5 - 5.0 g/dL   AST 17 15 - 41 U/L   ALT 17 14 - 54 U/L   Alkaline Phosphatase 49 38 - 126 U/L   Total Bilirubin 0.9 0.3 - 1.2 mg/dL    GFR calc non Af Amer >60 >60 mL/min   GFR calc Af Amer >60 >60 mL/min    Comment: (NOTE) The eGFR has been calculated using the CKD EPI equation. This calculation has not been validated in all clinical situations. eGFR's persistently <60 mL/min signify possible Chronic Kidney Disease.    Anion gap 7 5 - 15  CBC WITH DIFFERENTIAL     Status: Abnormal   Collection Time: 10/18/17 12:12 PM  Result Value Ref Range   WBC 13.2 (H) 4.0 - 10.5 K/uL   RBC 4.85 3.87 - 5.11 MIL/uL   Hemoglobin 15.4 (H) 12.0 - 15.0 g/dL   HCT 45.0 36.0 - 46.0 %   MCV 92.8 78.0 - 100.0 fL   MCH 31.8 26.0 - 34.0 pg   MCHC 34.2 30.0 - 36.0 g/dL   RDW 12.4 11.5 - 15.5 %   Platelets 258 150 - 400 K/uL   Neutrophils Relative % 60 %   Neutro Abs 7.9 (H) 1.7 - 7.7 K/uL   Lymphocytes Relative 31 %   Lymphs Abs 4.1 (H) 0.7 - 4.0 K/uL   Monocytes Relative 9 %   Monocytes Absolute 1.2 (H) 0.1 - 1.0 K/uL   Eosinophils Relative 0 %   Eosinophils Absolute 0.1 0.0 - 0.7 K/uL   Basophils Relative 0 %   Basophils Absolute 0.0 0.0 - 0.1 K/uL  APTT     Status: None   Collection Time: 10/18/17 12:12 PM  Result Value Ref Range   aPTT 25 24 - 36 seconds  Protime-INR     Status: None   Collection Time: 10/18/17 12:12 PM  Result Value Ref Range   Prothrombin Time 12.2 11.4 - 15.2 seconds   INR 0.91    Review of Systems  Constitutional: Positive for malaise/fatigue. Negative for chills, diaphoresis, fever and weight loss.  HENT: Negative.   Eyes: Negative.   Respiratory: Negative.   Cardiovascular: Negative.   Gastrointestinal: Negative.   Genitourinary: Negative.   Musculoskeletal: Positive for back pain and myalgias. Negative for falls, joint pain and neck pain.  Skin: Negative.   Neurological: Negative.  Negative for weakness.  Endo/Heme/Allergies: Negative.   Psychiatric/Behavioral: Negative for depression, hallucinations, memory loss, substance abuse and suicidal ideas. The patient is nervous/anxious. The  patient does not have insomnia.     Blood pressure 117/75, pulse 73, temperature 98 F (36.7 C), temperature source Oral, resp. rate 16, last menstrual period 10/17/2017, SpO2 100 %. Physical Exam  Constitutional: She is oriented to person, place, and time. She appears well-developed and well-nourished. No distress.  HENT:  Head: Normocephalic and atraumatic.  Right Ear: External ear normal.  Left  Ear: External ear normal.  Nose: Nose normal.  Mouth/Throat: Oropharynx is clear and moist.  Eyes: Conjunctivae and EOM are normal.  Neck: Normal range of motion. Neck supple.  Cardiovascular: Normal rate, regular rhythm, normal heart sounds and intact distal pulses.  No murmur heard. Respiratory: Effort normal and breath sounds normal. No respiratory distress. She has no wheezes.  GI: Soft. Bowel sounds are normal. She exhibits no distension. There is no tenderness.  Musculoskeletal:       Right hip: Normal.       Left hip: Normal.       Right knee: Normal.       Left knee: Normal.       Lumbar back: She exhibits decreased range of motion, tenderness, pain and spasm.  Neurological: She is alert and oriented to person, place, and time. She has normal strength. No sensory deficit.  Skin: No rash noted. She is not diaphoretic. No erythema.  Psychiatric: She has a normal mood and affect. Her behavior is normal.     Assessment/Plan Recurrent disc herniation L5-S1, right She needs a decompressive lumbar laminectomy and microdiscectomy L5-S1 right. We are admitting her for pain management and assistance with ADLs at this time. Plan for OR with Dr. Gladstone Lighter on Monday. Dr. Gladstone Lighter discuss the plan with the patient over the phone and reviewed the MRI with the patient. Patient will be NPO after midnight on Monday morning.   Patina Spanier LAUREN, PA-C 10/18/2017, 3:02 PM

## 2017-10-19 LAB — SURGICAL PCR SCREEN
MRSA, PCR: NEGATIVE
STAPHYLOCOCCUS AUREUS: NEGATIVE

## 2017-10-19 MED ORDER — OXYCODONE HCL 5 MG PO TABS
5.0000 mg | ORAL_TABLET | ORAL | Status: DC | PRN
  Administered 2017-10-19: 5 mg via ORAL
  Filled 2017-10-19: qty 1

## 2017-10-19 NOTE — Progress Notes (Signed)
Subjective:   Procedure(s) (LRB): Decompressive lumbar laminectomy and microdisectomy L5-S1 right (N/A) Patient reports pain as 8 on 0-10 scale. I came in this evening to re-evaluate her. She continues to be in pain but we have better control of her pain in the hospital. She literally had to urinate in a cup at home because she couldn't stand up because of the severe pain. We will do a decompressive Lumbar Laminectomy and Microdiscectomy on Monday.    Objective: Vital signs in last 24 hours: Temp:  [98.1 F (36.7 C)-98.5 F (36.9 C)] 98.5 F (36.9 C) (12/08 1553) Pulse Rate:  [70-78] 78 (12/08 1553) Resp:  [14-16] 16 (12/08 1553) BP: (112-121)/(66-75) 116/69 (12/08 1553) SpO2:  [97 %-100 %] 97 % (12/08 1553) Weight:  [59 kg (130 lb)] 59 kg (130 lb) (12/08 0425)  Intake/Output from previous day: 12/07 0701 - 12/08 0700 In: 2245 [P.O.:1140; I.V.:1050; IV Piggyback:55] Out: -  Intake/Output this shift: No intake/output data recorded.  Recent Labs    10/18/17 1212  HGB 15.4*   Recent Labs    10/18/17 1212  WBC 13.2*  RBC 4.85  HCT 45.0  PLT 258   Recent Labs    10/18/17 1212  NA 138  K 3.5  CL 104  CO2 27  BUN 16  CREATININE 0.66  GLUCOSE 83  CALCIUM 8.4*   Recent Labs    10/18/17 1212  INR 0.91    Dorsiflexion/Plantar flexion intact Compartment soft  Assessment/Plan:   Procedure(s) (LRB): Decompressive lumbar laminectomy and microdisectomy L5-S1 right (N/A) Up with therapy  Bathroom only. Latanya Maudlin 10/19/2017, 7:59 PM

## 2017-10-19 NOTE — Progress Notes (Signed)
Subjective:   Procedure(s) (LRB): Decompressive lumbar laminectomy and microdisectomy L5-S1 right (N/A)  Patient reports pain as moderate to severe.  Tolerating POs.  Reports that she is having a hard time stay comfortable.  C/o of headache this morning that she attributes to lack of caffeine.  Husband at bedside  Objective:   VITALS:  Temp:  [98 F (36.7 C)-98.3 F (36.8 C)] 98.1 F (36.7 C) (12/08 0434) Pulse Rate:  [70-77] 70 (12/08 0434) Resp:  [14-16] 14 (12/08 0434) BP: (112-121)/(66-75) 112/66 (12/08 0434) SpO2:  [98 %-100 %] 100 % (12/08 0434) Weight:  [59 kg (130 lb)] 59 kg (130 lb) (12/08 0425)  General: WDWN patient in NAD. Psych:  Appropriate mood and affect. Neuro:  A&O x 3, Moving all extremities, sensation intact to light touch HEENT:  EOMs intact Chest:  Even non-labored respirations Skin: C/D/I, no rashes or lesions Extremities: warm/dry, no edema, erythema, or echymosis.  No lymphadenopathy. Pulses: Popliteus 2+ MSK:  ROM: full knee ROM compared bilaterally, decreased lumbar ROM with pain, MMT: able to perform quad set, (-) Homan's    LABS Recent Labs    10/18/17 1212  HGB 15.4*  WBC 13.2*  PLT 258   Recent Labs    10/18/17 1212  NA 138  K 3.5  CL 104  CO2 27  BUN 16  CREATININE 0.66  GLUCOSE 83   Recent Labs    10/18/17 1212  INR 0.91     Assessment/Plan:   Procedure(s) (LRB): Decompressive lumbar laminectomy and microdisectomy L5-S1 right (N/A)  Patient seen in rounds for Dr. Gladstone Lighter Patient admitted for pain control Plan for OR with Dr. Gladstone Lighter on Monday for decompressive lumbar laminectomy and microdiscectomy L5-S1 right. NPO after midnight on Sunday.  Hold blood thinners.  Mechele Claude, PA-C [facility] Office:  [phone]

## 2017-10-19 NOTE — H&P (View-Only) (Signed)
Subjective:   Procedure(s) (LRB): Decompressive lumbar laminectomy and microdisectomy L5-S1 right (N/A) Patient reports pain as 8 on 0-10 scale. I came in this evening to re-evaluate her. She continues to be in pain but we have better control of her pain in the hospital. She literally had to urinate in a cup at home because she couldn't stand up because of the severe pain. We will do a decompressive Lumbar Laminectomy and Microdiscectomy on Monday.    Objective: Vital signs in last 24 hours: Temp:  [98.1 F (36.7 C)-98.5 F (36.9 C)] 98.5 F (36.9 C) (12/08 1553) Pulse Rate:  [70-78] 78 (12/08 1553) Resp:  [14-16] 16 (12/08 1553) BP: (112-121)/(66-75) 116/69 (12/08 1553) SpO2:  [97 %-100 %] 97 % (12/08 1553) Weight:  [59 kg (130 lb)] 59 kg (130 lb) (12/08 0425)  Intake/Output from previous day: 12/07 0701 - 12/08 0700 In: 2245 [P.O.:1140; I.V.:1050; IV Piggyback:55] Out: -  Intake/Output this shift: No intake/output data recorded.  Recent Labs    10/18/17 1212  HGB 15.4*   Recent Labs    10/18/17 1212  WBC 13.2*  RBC 4.85  HCT 45.0  PLT 258   Recent Labs    10/18/17 1212  NA 138  K 3.5  CL 104  CO2 27  BUN 16  CREATININE 0.66  GLUCOSE 83  CALCIUM 8.4*   Recent Labs    10/18/17 1212  INR 0.91    Dorsiflexion/Plantar flexion intact Compartment soft  Assessment/Plan:   Procedure(s) (LRB): Decompressive lumbar laminectomy and microdisectomy L5-S1 right (N/A) Up with therapy  Bathroom only. Latanya Maudlin 10/19/2017, 7:59 PM

## 2017-10-20 MED ORDER — VANCOMYCIN HCL IN DEXTROSE 1-5 GM/200ML-% IV SOLN: 1000.0000 mg | Freq: Once | INTRAVENOUS | Status: DC

## 2017-10-20 NOTE — Progress Notes (Signed)
   Subjective: Hospital day - 2 Patient reports pain as mild.   Patient seen in rounds for Dr. Gladstone Lighter Patient is well, but has had some minor complaints of pain in the back and leg, requiring pain medications Plan will be for decompression tomorrow per Dr. Gladstone Lighter. Resting with morning.   Objective: Vital signs in last 24 hours: Temp:  [98.5 F (36.9 C)-98.6 F (37 C)] 98.5 F (36.9 C) (12/09 0612) Pulse Rate:  [66-78] 66 (12/09 0612) Resp:  [15-16] 16 (12/09 0612) BP: (108-116)/(59-71) 108/59 (12/09 0612) SpO2:  [97 %] 97 % (12/09 0612)  Intake/Output from previous day:  Intake/Output Summary (Last 24 hours) at 10/20/2017 0731 Last data filed at 10/20/2017 0609 Gross per 24 hour  Intake 3450 ml  Output -  Net 3450 ml    Intake/Output this shift: No intake/output data recorded.  Labs: Recent Labs    10/18/17 1212  HGB 15.4*   Recent Labs    10/18/17 1212  WBC 13.2*  RBC 4.85  HCT 45.0  PLT 258   Recent Labs    10/18/17 1212  NA 138  K 3.5  CL 104  CO2 27  BUN 16  CREATININE 0.66  GLUCOSE 83  CALCIUM 8.4*   Recent Labs    10/18/17 1212  INR 0.91    EXAM General - Patient is Alert, Appropriate and Oriented Extremity - Neurovascular intact Sensation intact distally Intact pulses distally Dorsiflexion/Plantar flexion intact Motor Function - intact, moving foot and toes well on exam. Good strength with quad, hamstring, dorsiflexion, plantarflexion, inversion and eversion of foot, and EHL  Past Medical History:  Diagnosis Date  . Back pain   . Kidney stone   . Stomach ulcer     Assessment/Plan: Hospital day - 2 Active Problems:   Lumbar disc herniation with radiculopathy  Estimated body mass index is 25.39 kg/m as calculated from the following:   Height as of this encounter: 5' (1.524 m).   Weight as of this encounter: 59 kg (130 lb).  Surgery tomorrow NPO after MN Vanc on call to OR Hibiclens wash today  Arlee Muslim,  PA-C Orthopaedic Surgery 10/20/2017, 7:31 AM

## 2017-10-21 ENCOUNTER — Inpatient Hospital Stay (HOSPITAL_COMMUNITY): Payer: BLUE CROSS/BLUE SHIELD | Admitting: Registered Nurse

## 2017-10-21 ENCOUNTER — Inpatient Hospital Stay (HOSPITAL_COMMUNITY): Payer: BLUE CROSS/BLUE SHIELD

## 2017-10-21 ENCOUNTER — Encounter (HOSPITAL_COMMUNITY)
Admission: AD | Disposition: A | Discharge status: Home / Self Care | Payer: Self-pay | Source: Ambulatory Visit | Attending: Orthopedic Surgery

## 2017-10-21 ENCOUNTER — Encounter (HOSPITAL_COMMUNITY): Payer: Self-pay | Admitting: Certified Registered"

## 2017-10-21 HISTORY — PX: LUMBAR LAMINECTOMY/DECOMPRESSION MICRODISCECTOMY: SHX5026

## 2017-10-21 LAB — BASIC METABOLIC PANEL
ANION GAP: 4 — AB (ref 5–15)
BUN: 14 mg/dL (ref 6–20)
CO2: 27 mmol/L (ref 22–32)
Calcium: 7.9 mg/dL — ABNORMAL LOW (ref 8.9–10.3)
Chloride: 107 mmol/L (ref 101–111)
Creatinine, Ser: 0.61 mg/dL (ref 0.44–1.00)
Glucose, Bld: 92 mg/dL (ref 65–99)
POTASSIUM: 4.1 mmol/L (ref 3.5–5.1)
SODIUM: 138 mmol/L (ref 135–145)

## 2017-10-21 SURGERY — LUMBAR LAMINECTOMY/DECOMPRESSION MICRODISCECTOMY
Anesthesia: General | Site: Back

## 2017-10-21 MED ORDER — FENTANYL CITRATE (PF) 100 MCG/2ML IJ SOLN
25.0000 ug | INTRAMUSCULAR | Status: DC | PRN
  Administered 2017-10-21 (×2): 50 ug via INTRAVENOUS

## 2017-10-21 MED ORDER — OXYCODONE HCL 5 MG/5ML PO SOLN
5.0000 mg | Freq: Once | ORAL | Status: DC | PRN
  Filled 2017-10-21: qty 5

## 2017-10-21 MED ORDER — FENTANYL CITRATE (PF) 100 MCG/2ML IJ SOLN
INTRAMUSCULAR | Status: AC
  Administered 2017-10-21: 50 ug via INTRAVENOUS
  Filled 2017-10-21: qty 2

## 2017-10-21 MED ORDER — MENTHOL 3 MG MT LOZG: 1.0000 | LOZENGE | OROMUCOSAL | Status: DC | PRN

## 2017-10-21 MED ORDER — DEXAMETHASONE SODIUM PHOSPHATE 10 MG/ML IJ SOLN
INTRAMUSCULAR | Status: AC
  Filled 2017-10-21: qty 1

## 2017-10-21 MED ORDER — LIDOCAINE 2% (20 MG/ML) 5 ML SYRINGE
INTRAMUSCULAR | Status: DC | PRN
  Administered 2017-10-21: 80 mg via INTRAVENOUS

## 2017-10-21 MED ORDER — MIDAZOLAM HCL 2 MG/2ML IJ SOLN
INTRAMUSCULAR | Status: DC | PRN
  Administered 2017-10-21: 2 mg via INTRAVENOUS

## 2017-10-21 MED ORDER — FENTANYL CITRATE (PF) 250 MCG/5ML IJ SOLN
INTRAMUSCULAR | Status: DC | PRN
  Administered 2017-10-21 (×5): 50 ug via INTRAVENOUS

## 2017-10-21 MED ORDER — MEPERIDINE HCL 50 MG/ML IJ SOLN: 6.2500 mg | INTRAMUSCULAR | Status: DC | PRN

## 2017-10-21 MED ORDER — SODIUM CHLORIDE 0.9 % IV SOLN
INTRAVENOUS | Status: AC
  Filled 2017-10-21: qty 500000

## 2017-10-21 MED ORDER — LACTATED RINGERS IV SOLN
INTRAVENOUS | Status: DC
  Administered 2017-10-21: 19:00:00 via INTRAVENOUS

## 2017-10-21 MED ORDER — SODIUM CHLORIDE 0.9 % IV SOLN
INTRAVENOUS | Status: DC | PRN
  Administered 2017-10-21: 500 mL

## 2017-10-21 MED ORDER — PROPOFOL 10 MG/ML IV BOLUS
INTRAVENOUS | Status: AC
  Filled 2017-10-21: qty 20

## 2017-10-21 MED ORDER — SUGAMMADEX SODIUM 200 MG/2ML IV SOLN
INTRAVENOUS | Status: AC
  Filled 2017-10-21: qty 2

## 2017-10-21 MED ORDER — THROMBIN (RECOMBINANT) 5000 UNITS EX SOLR
OROMUCOSAL | Status: DC | PRN
  Administered 2017-10-21: 5 mL via TOPICAL

## 2017-10-21 MED ORDER — HYDROMORPHONE HCL 1 MG/ML IJ SOLN
INTRAMUSCULAR | Status: AC
  Filled 2017-10-21: qty 1

## 2017-10-21 MED ORDER — HYDROCODONE-ACETAMINOPHEN 5-325 MG PO TABS: 1.0000 | ORAL_TABLET | Freq: Four times a day (QID) | ORAL | 0 refills | Status: DC | PRN

## 2017-10-21 MED ORDER — BUPIVACAINE LIPOSOME 1.3 % IJ SUSP
20.0000 mL | Freq: Once | INTRAMUSCULAR | Status: AC
  Administered 2017-10-21: 20 mL
  Filled 2017-10-21: qty 20

## 2017-10-21 MED ORDER — ROCURONIUM BROMIDE 10 MG/ML (PF) SYRINGE
PREFILLED_SYRINGE | INTRAVENOUS | Status: DC | PRN
  Administered 2017-10-21: 10 mg via INTRAVENOUS
  Administered 2017-10-21: 50 mg via INTRAVENOUS
  Administered 2017-10-21: 10 mg via INTRAVENOUS

## 2017-10-21 MED ORDER — FENTANYL CITRATE (PF) 250 MCG/5ML IJ SOLN
INTRAMUSCULAR | Status: AC
  Filled 2017-10-21: qty 5

## 2017-10-21 MED ORDER — ROCURONIUM BROMIDE 50 MG/5ML IV SOSY
PREFILLED_SYRINGE | INTRAVENOUS | Status: AC
  Filled 2017-10-21: qty 5

## 2017-10-21 MED ORDER — HYDROMORPHONE HCL 1 MG/ML IJ SOLN
0.2500 mg | INTRAMUSCULAR | Status: DC | PRN
  Administered 2017-10-21: 0.5 mg via INTRAVENOUS

## 2017-10-21 MED ORDER — BACITRACIN-NEOMYCIN-POLYMYXIN 400-5-5000 EX OINT
TOPICAL_OINTMENT | CUTANEOUS | Status: AC
  Filled 2017-10-21: qty 1

## 2017-10-21 MED ORDER — POLYVINYL ALCOHOL 1.4 % OP SOLN
1.0000 [drp] | OPHTHALMIC | Status: DC | PRN
  Filled 2017-10-21: qty 15

## 2017-10-21 MED ORDER — PROPOFOL 10 MG/ML IV BOLUS
INTRAVENOUS | Status: DC | PRN
  Administered 2017-10-21: 150 mg via INTRAVENOUS

## 2017-10-21 MED ORDER — BACITRACIN-NEOMYCIN-POLYMYXIN 400-5-5000 EX OINT
TOPICAL_OINTMENT | CUTANEOUS | Status: DC | PRN
  Administered 2017-10-21: 1 via TOPICAL

## 2017-10-21 MED ORDER — THROMBIN (RECOMBINANT) 5000 UNITS EX SOLR
CUTANEOUS | Status: AC
  Filled 2017-10-21: qty 5000

## 2017-10-21 MED ORDER — PHENOL 1.4 % MT LIQD
1.0000 | OROMUCOSAL | Status: DC | PRN
  Filled 2017-10-21: qty 177

## 2017-10-21 MED ORDER — DEXAMETHASONE SODIUM PHOSPHATE 10 MG/ML IJ SOLN
INTRAMUSCULAR | Status: DC | PRN
  Administered 2017-10-21: 10 mg via INTRAVENOUS

## 2017-10-21 MED ORDER — BUPIVACAINE-EPINEPHRINE (PF) 0.5% -1:200000 IJ SOLN
INTRAMUSCULAR | Status: DC | PRN
  Administered 2017-10-21: 20 mL

## 2017-10-21 MED ORDER — ONDANSETRON HCL 4 MG/2ML IJ SOLN
INTRAMUSCULAR | Status: AC
  Filled 2017-10-21: qty 2

## 2017-10-21 MED ORDER — OXYCODONE HCL 5 MG PO TABS: 5.0000 mg | ORAL_TABLET | Freq: Once | ORAL | Status: DC | PRN

## 2017-10-21 MED ORDER — MIDAZOLAM HCL 2 MG/2ML IJ SOLN
INTRAMUSCULAR | Status: AC
  Filled 2017-10-21: qty 2

## 2017-10-21 MED ORDER — PROMETHAZINE HCL 25 MG/ML IJ SOLN: 6.2500 mg | INTRAMUSCULAR | Status: DC | PRN

## 2017-10-21 MED ORDER — ARTIFICIAL TEARS OP OINT
TOPICAL_OINTMENT | OPHTHALMIC | Status: AC
  Filled 2017-10-21: qty 3.5

## 2017-10-21 MED ORDER — LIDOCAINE 2% (20 MG/ML) 5 ML SYRINGE
INTRAMUSCULAR | Status: AC
  Filled 2017-10-21: qty 5

## 2017-10-21 MED ORDER — VANCOMYCIN HCL IN DEXTROSE 1-5 GM/200ML-% IV SOLN
INTRAVENOUS | Status: AC
  Filled 2017-10-21: qty 200

## 2017-10-21 MED ORDER — METHOCARBAMOL 500 MG PO TABS: 500.0000 mg | ORAL_TABLET | Freq: Three times a day (TID) | ORAL | 1 refills | Status: DC | PRN

## 2017-10-21 MED ORDER — BUPIVACAINE-EPINEPHRINE (PF) 0.5% -1:200000 IJ SOLN
INTRAMUSCULAR | Status: AC
  Filled 2017-10-21: qty 30

## 2017-10-21 MED ORDER — ONDANSETRON HCL 4 MG/2ML IJ SOLN
INTRAMUSCULAR | Status: DC | PRN
  Administered 2017-10-21: 4 mg via INTRAVENOUS

## 2017-10-21 MED ORDER — VANCOMYCIN HCL IN DEXTROSE 1-5 GM/200ML-% IV SOLN
1000.0000 mg | Freq: Once | INTRAVENOUS | Status: AC
  Administered 2017-10-22: 1000 mg via INTRAVENOUS

## 2017-10-21 MED ORDER — LACTATED RINGERS IV SOLN
INTRAVENOUS | Status: DC
  Administered 2017-10-21 (×2): via INTRAVENOUS

## 2017-10-21 SURGICAL SUPPLY — 52 items
AGENT HMST SPONGE THK3/8 (HEMOSTASIS) ×1
APL SKNCLS STERI-STRIP NONHPOA (GAUZE/BANDAGES/DRESSINGS) ×1
BAG SPEC THK2 15X12 ZIP CLS (MISCELLANEOUS) ×1
BAG ZIPLOCK 12X15 (MISCELLANEOUS) ×2 IMPLANT
BENZOIN TINCTURE PRP APPL 2/3 (GAUZE/BANDAGES/DRESSINGS) ×3 IMPLANT
CLEANER TIP ELECTROSURG 2X2 (MISCELLANEOUS) ×3 IMPLANT
COVER SURGICAL LIGHT HANDLE (MISCELLANEOUS) ×3 IMPLANT
DRAIN PENROSE 18X1/4 LTX STRL (WOUND CARE) IMPLANT
DRAPE MICROSCOPE LEICA (MISCELLANEOUS) ×3 IMPLANT
DRAPE POUCH INSTRU U-SHP 10X18 (DRAPES) ×3 IMPLANT
DRAPE SHEET LG 3/4 BI-LAMINATE (DRAPES) ×3 IMPLANT
DRAPE SURG 17X11 SM STRL (DRAPES) ×3 IMPLANT
DRSG ADAPTIC 3X8 NADH LF (GAUZE/BANDAGES/DRESSINGS) ×3 IMPLANT
DRSG PAD ABDOMINAL 8X10 ST (GAUZE/BANDAGES/DRESSINGS) ×12 IMPLANT
DURAPREP 26ML APPLICATOR (WOUND CARE) ×3 IMPLANT
ELECT BLADE TIP CTD 4 INCH (ELECTRODE) ×3 IMPLANT
ELECT REM PT RETURN 15FT ADLT (MISCELLANEOUS) ×3 IMPLANT
GAUZE SPONGE 4X4 12PLY STRL (GAUZE/BANDAGES/DRESSINGS) ×3 IMPLANT
GLOVE BIOGEL PI IND STRL 6.5 (GLOVE) ×1 IMPLANT
GLOVE BIOGEL PI IND STRL 7.5 (GLOVE) IMPLANT
GLOVE BIOGEL PI IND STRL 8.5 (GLOVE) ×1 IMPLANT
GLOVE BIOGEL PI INDICATOR 6.5 (GLOVE) ×2
GLOVE BIOGEL PI INDICATOR 7.5 (GLOVE) ×4
GLOVE BIOGEL PI INDICATOR 8.5 (GLOVE) ×2
GLOVE ECLIPSE 8.0 STRL XLNG CF (GLOVE) ×6 IMPLANT
GLOVE SURG SS PI 6.5 STRL IVOR (GLOVE) ×3 IMPLANT
GLOVE SURG SS PI 7.5 STRL IVOR (GLOVE) ×2 IMPLANT
GOWN STRL REUS W/ TWL LRG LVL3 (GOWN DISPOSABLE) ×1 IMPLANT
GOWN STRL REUS W/ TWL XL LVL3 (GOWN DISPOSABLE) IMPLANT
GOWN STRL REUS W/TWL LRG LVL3 (GOWN DISPOSABLE) ×3
GOWN STRL REUS W/TWL XL LVL3 (GOWN DISPOSABLE) ×7 IMPLANT
HEMOSTAT SPONGE AVITENE ULTRA (HEMOSTASIS) ×3 IMPLANT
KIT BASIN OR (CUSTOM PROCEDURE TRAY) ×3 IMPLANT
KIT POSITIONING SURG ANDREWS (MISCELLANEOUS) ×3 IMPLANT
MANIFOLD NEPTUNE II (INSTRUMENTS) ×3 IMPLANT
MARKER SKIN DUAL TIP RULER LAB (MISCELLANEOUS) ×3 IMPLANT
NDL SPNL 18GX3.5 QUINCKE PK (NEEDLE) ×2 IMPLANT
NEEDLE HYPO 22GX1.5 SAFETY (NEEDLE) ×6 IMPLANT
NEEDLE SPNL 18GX3.5 QUINCKE PK (NEEDLE) ×6 IMPLANT
PACK LAMINECTOMY ORTHO (CUSTOM PROCEDURE TRAY) ×3 IMPLANT
PAD ABD 8X10 STRL (GAUZE/BANDAGES/DRESSINGS) ×2 IMPLANT
PATTIES SURGICAL .5 X.5 (GAUZE/BANDAGES/DRESSINGS) ×2 IMPLANT
PATTIES SURGICAL .75X.75 (GAUZE/BANDAGES/DRESSINGS) ×3 IMPLANT
SPONGE LAP 4X18 X RAY DECT (DISPOSABLE) ×6 IMPLANT
STAPLER VISISTAT 35W (STAPLE) ×3 IMPLANT
SUT VIC AB 1 CT1 27 (SUTURE) ×6
SUT VIC AB 1 CT1 27XBRD ANTBC (SUTURE) ×2 IMPLANT
SUT VIC AB 2-0 CT1 27 (SUTURE) ×6
SUT VIC AB 2-0 CT1 TAPERPNT 27 (SUTURE) ×2 IMPLANT
SYR 20CC LL (SYRINGE) ×6 IMPLANT
TAPE CLOTH SURG 6X10 WHT LF (GAUZE/BANDAGES/DRESSINGS) ×2 IMPLANT
TOWEL OR 17X26 10 PK STRL BLUE (TOWEL DISPOSABLE) ×3 IMPLANT

## 2017-10-21 NOTE — Brief Op Note (Signed)
10/18/2017 - 10/21/2017  3:46 PM  PATIENT:  [name]  46 y.o. 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  POST-OPERATIVE DIAGNOSIS: Same as Pre-Op  PROCEDURE: Decompressive Lumbar Laminectomy at L-5-S-1 for spinal Stenosis and Foraminotomy at L-5-S-1 for Foraminal Stenosis and Microdiscectomy for a RECURRENT HNP at L-5-S-1.  SURGEON:  Surgeon(s) and Role:    * Latanya Maudlin, MD - Primary  PHYSICIAN ASSISTANT: Ardeen Jourdain PA  ASSISTANTS: Ardeen Jourdain   ANESTHESIA:  Armed forces operational officer PA  EBL:  50 mL   BLOOD ADMINISTERED:none  DRAINS: none   LOCAL MEDICATIONS USED:  MARCAINE20cc of 0.50% at the start of the case and 20cc of Exparel at the end of the case.     SPECIMEN:  Source of Specimen:  L-5-S-1  DISPOSITION OF SPECIMEN:  PATHOLOGY  COUNTS:  YES  TOURNIQUET:  * No tourniquets in log *  DICTATION: .Other Dictation: Dictation Number [id number]  PLAN OF CARE: Admit to inpatient   PATIENT DISPOSITION:  PACU - hemodynamically stable.   Delay start of Pharmacological VTE agent (>24hrs) due to surgical blood loss or risk of bleeding: yes

## 2017-10-21 NOTE — Plan of Care (Signed)
  Nutrition: Adequate nutrition will be maintained 10/21/2017 1854 - Progressing by Dorene Sorrow, RN   Elimination: Will not experience complications related to bowel motility 10/21/2017 1854 - Progressing by Dorene Sorrow, RN   Pain Managment: General experience of comfort will improve 10/21/2017 1854 - Progressing by Dorene Sorrow, RN

## 2017-10-21 NOTE — Transfer of Care (Signed)
Immediate Anesthesia Transfer of Care Note  Patient: [name]  Procedure(s) Performed: Decompressive lumbar laminectomy L5-S1 for spinal stenosis; Microdiscectomy at L5-S1 on right for recurrent disc herniation, and Foraminotomy for S1 root on right for foraminal stenosis (N/A Back)  Patient Location: PACU  Anesthesia Type:General  Level of Consciousness: awake, alert , oriented and patient cooperative  Airway & Oxygen Therapy: Patient Spontanous Breathing and Patient connected to face mask oxygen  Post-op Assessment: Report given to RN, Post -op Vital signs reviewed and stable and Patient moving all extremities X 4  Post vital signs: stable  Last Vitals:  Vitals:   10/20/17 2308 10/21/17 0652  BP: 120/79 121/69  Pulse: 75 72  Resp: 16 17  Temp: 37.1 C 37 C  SpO2: 97% 99%    Last Pain:  Vitals:   10/21/17 1300  TempSrc:   PainSc: 3       Patients Stated Pain Goal: 3 (37/62/83 1517)  Complications: No apparent anesthesia complications

## 2017-10-21 NOTE — Interval H&P Note (Signed)
History and Physical Interval Note:  10/21/2017 1:08 PM  [name]  has presented today for surgery, with the diagnosis of Recurrent disc hernitation L5-S1 right  The various methods of treatment have been discussed with the patient and family. After consideration of risks, benefits and other options for treatment, the patient has consented to  Procedure(s): Decompressive lumbar laminectomy and microdisectomy L5-S1 right (N/A) as a surgical intervention .  The patient's history has been reviewed, patient examined, no change in status, stable for surgery.  I have reviewed the patient's chart and labs.  Questions were answered to the patient's satisfaction.     Latanya Maudlin

## 2017-10-21 NOTE — Discharge Instructions (Signed)

## 2017-10-21 NOTE — Anesthesia Postprocedure Evaluation (Signed)
Anesthesia Post Note  Patient: Anthonia Monger  Procedure(s) Performed: Decompressive lumbar laminectomy L5-S1 for spinal stenosis; Microdiscectomy at L5-S1 on right for recurrent disc herniation, and Foraminotomy for S1 root on right for foraminal stenosis (N/A Back)     Patient location during evaluation: PACU Anesthesia Type: General Level of consciousness: awake and alert Pain management: pain level controlled Vital Signs Assessment: post-procedure vital signs reviewed and stable Respiratory status: spontaneous breathing, nonlabored ventilation and respiratory function stable Cardiovascular status: blood pressure returned to baseline and stable Postop Assessment: no apparent nausea or vomiting Anesthetic complications: no    Last Vitals:  Vitals:   10/21/17 1645 10/21/17 1700  BP: 127/81 137/87  Pulse: 86 81  Resp: 12 15  Temp: 36.7 C 36.7 C  SpO2: 100% 100%    Last Pain:  Vitals:   10/21/17 1700  TempSrc:   PainSc: 2                  Lynda Rainwater

## 2017-10-21 NOTE — Progress Notes (Signed)
Pharmacy Antibiotic Note  [name] is a 46 y.o. female admitted on 10/18/2017 with surgical prophylaxis.  Pharmacy has been consulted for vancomycin dosing.  Patient received vancomycin 1g pre-op dose at 1pm 12/10.  Plan: Vancomycin 1g x 1 at 0100 12/11. Pharmacy to sign off.  Height: 5' (152.4 cm) Weight: 130 lb (59 kg) IBW/kg (Calculated) : 45.5  Temp (24hrs), Avg:98.3 F (36.8 C), Min:98 F (36.7 C), Max:98.7 F (37.1 C)  Recent Labs  Lab 10/18/17 1212 10/21/17 0529  WBC 13.2*  --   CREATININE 0.66 0.61    Estimated Creatinine Clearance: 72.1 mL/min (by C-G formula based on SCr of 0.61 mg/dL).    Allergies  Allergen Reactions  . Amoxicillin Hives    Has patient had a PCN reaction causing immediate rash, facial/tongue/throat swelling, SOB or lightheadedness with hypotension: Unknown Has patient had a PCN reaction causing severe rash involving mucus membranes or skin necrosis: Yes Has patient had a PCN reaction that required hospitalization: No Has patient had a PCN reaction occurring within the last 10 years: No If all of the above answers are "NO", then may proceed with Cephalosporin use.   . Dilaudid [Hydromorphone Hcl]     "starts seeing things"  . Penicillins Hives    Has patient had a PCN reaction causing immediate rash, facial/tongue/throat swelling, SOB or lightheadedness with hypotension: Unknown Has patient had a PCN reaction causing severe rash involving mucus membranes or skin necrosis: Yes Has patient had a PCN reaction that required hospitalization: No Has patient had a PCN reaction occurring within the last 10 years: No If all of the above answers are "NO", then may proceed with Cephalosporin use.   . Sulfa Antibiotics Hives     Thank you for allowing pharmacy to be a part of this patient's care.  Hershal Coria 10/21/2017 6:10 PM

## 2017-10-21 NOTE — Anesthesia Procedure Notes (Signed)

## 2017-10-21 NOTE — Anesthesia Preprocedure Evaluation (Signed)
Anesthesia Evaluation  Patient identified by MRN, date of birth, ID band Patient awake    Reviewed: Allergy & Precautions, H&P , NPO status , Patient's Chart, lab work & pertinent test results  Airway Mallampati: I  TM Distance: >3 FB Neck ROM: Full    Dental  (+) Dental Advisory Given, Teeth Intact   Pulmonary neg pulmonary ROS,    breath sounds clear to auscultation       Cardiovascular negative cardio ROS   Rhythm:Regular Rate:Normal     Neuro/Psych negative neurological ROS  negative psych ROS   GI/Hepatic Neg liver ROS, PUD,   Endo/Other  negative endocrine ROS  Renal/GU      Musculoskeletal negative musculoskeletal ROS (+)   Abdominal (+) - obese,   Peds  Hematology negative hematology ROS (+)   Anesthesia Other Findings   Reproductive/Obstetrics negative OB ROS                             Anesthesia Physical  Anesthesia Plan  ASA: II  Anesthesia Plan: General   Post-op Pain Management:    Induction: Intravenous  PONV Risk Score and Plan: 3 and Ondansetron, Dexamethasone and Midazolam  Airway Management Planned: Oral ETT  Additional Equipment:   Intra-op Plan:   Post-operative Plan: Extubation in OR  Informed Consent: I have reviewed the patients History and Physical, chart, labs and discussed the procedure including the risks, benefits and alternatives for the proposed anesthesia with the patient or authorized representative who has indicated his/her understanding and acceptance.   Dental advisory given  Plan Discussed with: CRNA  Anesthesia Plan Comments:         Anesthesia Quick Evaluation

## 2017-10-22 ENCOUNTER — Encounter (HOSPITAL_COMMUNITY): Payer: Self-pay | Admitting: Orthopedic Surgery

## 2017-10-22 NOTE — Progress Notes (Signed)
Subjective: 1 Day Post-Op Procedure(s) (LRB): Decompressive lumbar laminectomy L5-S1 for spinal stenosis; Microdiscectomy at L5-S1 on right for recurrent disc herniation, and Foraminotomy for S1 root on right for foraminal stenosis (N/A) Patient reports pain as 2 on 0-10 scale.No further leg pain. Minimal pain in her back. She is up ambulating and has passed her urine.Will DC.    Objective: Vital signs in last 24 hours: Temp:  [98 F (36.7 C)-98.7 F (37.1 C)] 98.5 F (36.9 C) (12/11 0600) Pulse Rate:  [67-99] 67 (12/11 0600) Resp:  [12-20] 20 (12/11 0600) BP: (88-137)/(52-87) 105/68 (12/11 0600) SpO2:  [96 %-100 %] 98 % (12/11 0600)  Intake/Output from previous day: 12/10 0701 - 12/11 0700 In: 3510 [P.O.:240; I.V.:3270] Out: 50 [Blood:50] Intake/Output this shift: No intake/output data recorded.  No results for input(s): HGB in the last 72 hours. No results for input(s): WBC, RBC, HCT, PLT in the last 72 hours. Recent Labs    10/21/17 0529  NA 138  K 4.1  CL 107  CO2 27  BUN 14  CREATININE 0.61  GLUCOSE 92  CALCIUM 7.9*   No results for input(s): LABPT, INR in the last 72 hours.  Dorsiflexion/Plantar flexion intact Compartment soft  Assessment/Plan: 1 Day Post-Op Procedure(s) (LRB): Decompressive lumbar laminectomy L5-S1 for spinal stenosis; Microdiscectomy at L5-S1 on right for recurrent disc herniation, and Foraminotomy for S1 root on right for foraminal stenosis (N/A) Up with therapy.Discharged.  Latanya Maudlin 10/22/2017, 8:39 AM

## 2017-10-22 NOTE — Op Note (Signed)
[name], IMPSON NO.:  [id number]  MEDICAL RECORD NO.:  [id number]  LOCATION:                                 FACILITY:  PHYSICIAN:  Kipp Brood. [name], M.D.DATE OF BIRTH:  1970/11/20  DATE OF PROCEDURE:  10/21/2017 DATE OF DISCHARGE:                              OPERATIVE REPORT   SURGEON:  Kipp Brood. Gladstone Lighter, M.D.  ASSISTANT:  Ardeen Jourdain, [place]  PREOPERATIVE DIAGNOSES: 1. Spinal stenosis with severe recess stenosis at L5-S1 on the right. 2. 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 3. 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  POSTOPERATIVE DIAGNOSES: 1. Spinal stenosis with severe recess stenosis at L5-S1 on the right. 2. 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 3. 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  OPERATIONS: 1. Complete decompressive lumbar laminectomy at L5-S1 on the right for     spinal stenosis. 2. Foraminotomy for the S1 root on the right. 3. Microdiskectomy for a recurrent herniated disk at L5-S1 on the     right, note all her symptoms were on the right.  DESCRIPTION OF PROCEDURE:  Under general anesthesia with the patient on spinal frame, a routine orthopedic prep and draping of the back was carried out.  The appropriate time-out was first carried out.  I also marked the appropriate right side of the back in the holding area.  Two needles then were placed in the back for localization purposes, x-ray was taken.  We then injected 20 mL of 0.5% Marcaine with epinephrine at the beginning of the procedure to control bleeding.  At the end of the procedure, we injected 20 mL of plain Exparel into the soft tissue. Then, the incision was made over the L5-S1 region just above our needle placements.  Bleeders were identified and cauterized.  Self-retaining tractors were inserted.  I then separated the muscle from the lamina and spinous processes bilaterally.  Another x-ray was taken  for verification.  We then went down, took another x-ray after we identified the L5-S1 space.  The McCullough retractors were then inserted.  I then completed my decompressive lumbar laminectomy.  I went central because of the marked overlap of the laminae from a collapse of the disk space from previous surgery.  We first identified the ligamentum flavum. There was an excessive amount of scar over the nerve root on the right. We brought the microscope in and gently dissected the scar tissue off the root.  Once the root was identified, we then were easily able to retract the dura.  Another x-ray was taken to verify our position. Following that, after removing the scar tissue from the S1 root and identifying the root, I then went down and did a nice foraminotomy for the root to free the root up.  I went out and did a nice decompression in the lateral recess to free up the scar tissue in that area.  Once this was done, we then went down and identified two large pieces of recurrent disks that we removed in usual fashion.  After this, the nerve root and the dura now were quite free.  We thoroughly  irrigated out the area.  We researched the area to make sure that there were no further disk fragments.  The fragments were sent to the lab.  I then irrigated the area out again and loosely applied some thrombin-soaked Gelfoam.  I closed the wound layers in usual fashion with #1 Vicryl.  The small distal deep and proximal part of the wound were left open for drainage purposes.  Subcu was closed in the usual fashion after we injected 20 mL of Exparel.  The skin now was closed with metal staples.  Sterile dressings were applied.  She had 1 g of IV Ancef preop.          ______________________________ Kipp Brood. Gladstone Lighter, M.D.     RAG/MEDQ  D:  10/21/2017  T:  10/22/2017  Job:  [id number]

## 2017-10-22 NOTE — Evaluation (Signed)
Physical Therapy Evaluation Patient Details Name: [name] MRN: [id number] DOB: Jun 01, 1971 Today's Date: 10/22/2017   History of Present Illness  Complete decompressive lumbar laminectomy at L5-S1 on the right ,Foraminotomy for the S1 root on the right, Microdiskectomy for a recurrent herniated disk at L5-S1 on the right., History of back surgery  Clinical Impression  The patient ambulated x 120' with AD. Attempted  With RW but increased pain. Encouraged ambulation. Will check back prior to DC and decide on need for stair review. Pt admitted with above diagnosis. Pt currently with functional limitations due to the deficits listed below (see PT Problem List).  Pt will benefit from skilled PT to increase their independence and safety with mobility to allow discharge to the venue listed below.       Follow Up Recommendations No PT follow up    Equipment Recommendations  None recommended by PT    Recommendations for Other Services       Precautions / Restrictions Precautions Precautions: Back Precaution Comments: reviewed BLT precautions      Mobility  Bed Mobility               General bed mobility comments: standing  Transfers                 General transfer comment: NT  Ambulation/Gait Ambulation/Gait assistance: Min guard Ambulation Distance (Feet): 180 Feet Assistive device: None Gait Pattern/deviations: Step-through pattern     General Gait Details: slow and guarded  Stairs            Wheelchair Mobility    Modified Rankin (Stroke Patients Only)       Balance                                             Pertinent Vitals/Pain Pain Assessment: 0-10 Pain Score: 8  Pain Location: back Pain Descriptors / Indicators: Grimacing;Guarding Pain Intervention(s): Limited activity within patient's tolerance;Monitored during session    Home Living Family/patient expects to be discharged to:: Private residence Living  Arrangements: Spouse/significant other Available Help at Discharge: Family Type of Home: House Home Access: Stairs to enter   Technical brewer of Steps: 1   Home Equipment: Environmental consultant - 2 wheels      Prior Function Level of Independence: Independent               Hand Dominance        Extremity/Trunk Assessment   Upper Extremity Assessment Upper Extremity Assessment: Overall WFL for tasks assessed    Lower Extremity Assessment Lower Extremity Assessment: Overall WFL for tasks assessed    Cervical / Trunk Assessment Cervical / Trunk Assessment: Other exceptions Cervical / Trunk Exceptions: guarded in back  Communication   Communication: No difficulties  Cognition Arousal/Alertness: Awake/alert Behavior During Therapy: WFL for tasks assessed/performed Overall Cognitive Status: Within Functional Limits for tasks assessed                                        General Comments      Exercises     Assessment/Plan    PT Assessment Patient needs continued PT services  PT Problem List Decreased strength;Decreased range of motion;Decreased activity tolerance;Decreased mobility;Decreased coordination;Decreased knowledge of precautions;Decreased safety awareness;Decreased knowledge of use of DME  PT Treatment Interventions Gait training;Stair training;Patient/family education;Functional mobility training    PT Goals (Current goals can be found in the Care Plan section)  Acute Rehab PT Goals Patient Stated Goal: to go home and shower PT Goal Formulation: With patient/family Time For Goal Achievement: 10/23/17 Potential to Achieve Goals: Good    Frequency Min 5X/week   Barriers to discharge        Co-evaluation               AM-PAC PT "6 Clicks" Daily Activity  Outcome Measure Difficulty turning over in bed (including adjusting bedclothes, sheets and blankets)?: A Little Difficulty moving from lying on back to sitting on the  side of the bed? : A Little Difficulty sitting down on and standing up from a chair with arms (e.g., wheelchair, bedside commode, etc,.)?: A Little Help needed moving to and from a bed to chair (including a wheelchair)?: A Little Help needed walking in hospital room?: A Little Help needed climbing 3-5 steps with a railing? : A Little 6 Click Score: 18    End of Session   Activity Tolerance: Patient tolerated treatment well Patient left: with family/visitor present Nurse Communication: Mobility status PT Visit Diagnosis: Unsteadiness on feet (R26.81)    Time: 0812-0824 PT Time Calculation (min) (ACUTE ONLY): 12 min   Charges:   PT Evaluation $PT Eval Low Complexity: 1 Low     PT G CodesTresa Endo PT [phone]   Claretha Cooper 10/22/2017, 8:34 AM

## 2017-10-22 NOTE — Progress Notes (Signed)
Dressing changed per MD order.  Husband observed.  Husband stated he will do dressing changes at home.  Discharge instructions and medications discussed with patient.  Prescriptions, dressing supplies and AVS given to patient.  All questions answered.

## 2017-10-22 NOTE — Progress Notes (Signed)
Per Suanne Marker, CM ok to override code 44 and print AVS.

## 2017-10-22 NOTE — Progress Notes (Signed)
Date: October 22, 2017 Discharge orders review for case management needs.  None found  Per patient or family member no additional needs at home. Velva Harman, BSN, [place], CCM:  [phone]

## 2017-10-22 NOTE — Progress Notes (Signed)
Physical Therapy Treatment Patient Details Name: [name] MRN: [id number] DOB: 1971/05/11 Today's Date: 10/22/2017    History of Present Illness Complete decompressive lumbar laminectomy at L5-S1 on the right ,Foraminotomy for the S1 root on the right, Microdiskectomy for a recurrent herniated disk at L5-S1 on the right., History of back surgery    PT Comments    The patient practiced stairs without a problem. Ready for DC.   Follow Up Recommendations  No PT follow up     Equipment Recommendations  None recommended by PT    Recommendations for Other Services       Precautions / Restrictions Precautions Precautions: Back Precaution Comments: reviewed BLT precautions    Mobility  Bed Mobility               General bed mobility comments: in recliner  Transfers                 General transfer comment: independent  Ambulation/Gait Ambulation/Gait assistance: Supervision Ambulation Distance (Feet): 30 Feet Assistive device: None Gait Pattern/deviations: Step-through pattern     General Gait Details: slow and guarded   Stairs Stairs: Yes   Stair Management: One rail Left;Forwards Number of Stairs: 5 General stair comments: cues for safety  Wheelchair Mobility    Modified Rankin (Stroke Patients Only)       Balance                                            Cognition Arousal/Alertness: Awake/alert Behavior During Therapy: WFL for tasks assessed/performed Overall Cognitive Status: Within Functional Limits for tasks assessed                                        Exercises      General Comments        Pertinent Vitals/Pain Pain Assessment: 0-10 Pain Score: 4  Pain Location: back Pain Descriptors / Indicators: Grimacing;Guarding Pain Intervention(s): Monitored during session;Premedicated before session    Home Living Family/patient expects to be discharged to:: Private residence Living  Arrangements: Spouse/significant other Available Help at Discharge: Family Type of Home: House Home Access: Stairs to enter     Home Equipment: Environmental consultant - 2 wheels      Prior Function Level of Independence: Independent          PT Goals (current goals can now be found in the care plan section) Acute Rehab PT Goals Patient Stated Goal: to go home and shower PT Goal Formulation: With patient/family Time For Goal Achievement: 10/23/17 Potential to Achieve Goals: Good Progress towards PT goals: Progressing toward goals    Frequency    Min 5X/week      PT Plan Current plan remains appropriate    Co-evaluation              AM-PAC PT "6 Clicks" Daily Activity  Outcome Measure  Difficulty turning over in bed (including adjusting bedclothes, sheets and blankets)?: None Difficulty moving from lying on back to sitting on the side of the bed? : None Difficulty sitting down on and standing up from a chair with arms (e.g., wheelchair, bedside commode, etc,.)?: None Help needed moving to and from a bed to chair (including a wheelchair)?: None Help needed walking in hospital room?: None Help needed climbing 3-5  steps with a railing? : None 6 Click Score: 24    End of Session   Activity Tolerance: Patient tolerated treatment well Patient left: with family/visitor present Nurse Communication: Mobility status PT Visit Diagnosis: Unsteadiness on feet (R26.81)     Time: 7125-2712 PT Time Calculation (min) (ACUTE ONLY): 10 min  Charges:  $Gait Training: 8-22 mins                    G Codes:          Claretha Cooper 10/22/2017, 10:18 AM

## 2017-10-22 NOTE — Discharge Summary (Signed)
Physician Discharge Summary   Patient ID: [name] MRN: [id number] DOB/AGE: [birth year] 46 y.o.  Admit date: 10/18/2017 Discharge date: 10/22/2017  Primary Diagnosis: Lumbar disc herniation L5-S1 right  Admission Diagnoses:  Past Medical History:  Diagnosis Date  . Back pain   . Kidney stone   . Stomach ulcer    Discharge Diagnoses:   Active Problems:   Lumbar disc herniation with radiculopathy  Estimated body mass index is 25.39 kg/m as calculated from the following:   Height as of this encounter: 5' (1.524 m).   Weight as of this encounter: 59 kg (130 lb).  Procedure:  Procedure(s) (LRB): Decompressive lumbar laminectomy L5-S1 for spinal stenosis; Microdiscectomy at L5-S1 on right for recurrent disc herniation, and Foraminotomy for S1 root on right for foraminal stenosis (N/A)   Consults: None  HPI: [name] presented to the office about a week ago with the chief complaint of worsening low back pain over the past 2 weeks. No injury that she can recall. She reported pain in the low back as well as in the right leg. She has a history of a disc rupture at L5-S1 on the right in 2014. She has done well until this recent increase in pain. MRI was ordered and showed a large recurrent disc rupture at L5-S1 on the right. She was unable to control pain and perform ADLs at home. She was admitted for pain management ahead of surgery.    Laboratory Data: Admission on 10/18/2017, Discharged on 10/22/2017  Component Date Value Ref Range Status  . Sodium 10/18/2017 138  135 - 145 mmol/L Final  . Potassium 10/18/2017 3.5  3.5 - 5.1 mmol/L Final  . Chloride 10/18/2017 104  101 - 111 mmol/L Final  . CO2 10/18/2017 27  22 - 32 mmol/L Final  . Glucose, Bld 10/18/2017 83  65 - 99 mg/dL Final  . BUN 10/18/2017 16  6 - 20 mg/dL Final  . Creatinine, Ser 10/18/2017 0.66  0.44 - 1.00 mg/dL Final  . Calcium 10/18/2017 8.4* 8.9 - 10.3 mg/dL Final  . Total Protein 10/18/2017 6.9  6.5 - 8.1 g/dL  Final  . Albumin 10/18/2017 3.9  3.5 - 5.0 g/dL Final  . AST 10/18/2017 17  15 - 41 U/L Final  . ALT 10/18/2017 17  14 - 54 U/L Final  . Alkaline Phosphatase 10/18/2017 49  38 - 126 U/L Final  . Total Bilirubin 10/18/2017 0.9  0.3 - 1.2 mg/dL Final  . GFR calc non Af Amer 10/18/2017 >60  >60 mL/min Final  . GFR calc Af Amer 10/18/2017 >60  >60 mL/min Final   Comment: (NOTE) The eGFR has been calculated using the CKD EPI equation. This calculation has not been validated in all clinical situations. eGFR's persistently <60 mL/min signify possible Chronic Kidney Disease.   . Anion gap 10/18/2017 7  5 - 15 Final  . WBC 10/18/2017 13.2* 4.0 - 10.5 K/uL Final  . RBC 10/18/2017 4.85  3.87 - 5.11 MIL/uL Final  . Hemoglobin 10/18/2017 15.4* 12.0 - 15.0 g/dL Final  . HCT 10/18/2017 45.0  36.0 - 46.0 % Final  . MCV 10/18/2017 92.8  78.0 - 100.0 fL Final  . MCH 10/18/2017 31.8  26.0 - 34.0 pg Final  . MCHC 10/18/2017 34.2  30.0 - 36.0 g/dL Final  . RDW 10/18/2017 12.4  11.5 - 15.5 % Final  . Platelets 10/18/2017 258  150 - 400 K/uL Final  . Neutrophils Relative % 10/18/2017 60  % Final  .  Neutro Abs 10/18/2017 7.9* 1.7 - 7.7 K/uL Final  . Lymphocytes Relative 10/18/2017 31  % Final  . Lymphs Abs 10/18/2017 4.1* 0.7 - 4.0 K/uL Final  . Monocytes Relative 10/18/2017 9  % Final  . Monocytes Absolute 10/18/2017 1.2* 0.1 - 1.0 K/uL Final  . Eosinophils Relative 10/18/2017 0  % Final  . Eosinophils Absolute 10/18/2017 0.1  0.0 - 0.7 K/uL Final  . Basophils Relative 10/18/2017 0  % Final  . Basophils Absolute 10/18/2017 0.0  0.0 - 0.1 K/uL Final  . aPTT 10/18/2017 25  24 - 36 seconds Final  . Prothrombin Time 10/18/2017 12.2  11.4 - 15.2 seconds Final  . INR 10/18/2017 0.91   Final  . MRSA, PCR 10/19/2017 NEGATIVE  NEGATIVE Final  . Staphylococcus aureus 10/19/2017 NEGATIVE  NEGATIVE Final   Comment: (NOTE) The Xpert SA Assay (FDA approved for NASAL specimens in patients 44 years of age and  older), is one component of a comprehensive surveillance program. It is not intended to diagnose infection nor to guide or monitor treatment.   . Sodium 10/21/2017 138  135 - 145 mmol/L Final  . Potassium 10/21/2017 4.1  3.5 - 5.1 mmol/L Final  . Chloride 10/21/2017 107  101 - 111 mmol/L Final  . CO2 10/21/2017 27  22 - 32 mmol/L Final  . Glucose, Bld 10/21/2017 92  65 - 99 mg/dL Final  . BUN 10/21/2017 14  6 - 20 mg/dL Final  . Creatinine, Ser 10/21/2017 0.61  0.44 - 1.00 mg/dL Final  . Calcium 10/21/2017 7.9* 8.9 - 10.3 mg/dL Final  . GFR calc non Af Amer 10/21/2017 >60  >60 mL/min Final  . GFR calc Af Amer 10/21/2017 >60  >60 mL/min Final   Comment: (NOTE) The eGFR has been calculated using the CKD EPI equation. This calculation has not been validated in all clinical situations. eGFR's persistently <60 mL/min signify possible Chronic Kidney Disease.   . Anion gap 10/21/2017 4* 5 - 15 Final     X-Rays:Dg Lumbar Spine 2-3 Views  Result Date: 10/18/2017 CLINICAL DATA:  Preoperative evaluation herniated disc. EXAM: LUMBAR SPINE - 2-3 VIEW COMPARISON:  Lumbar spine radiographs February 19, 2013 FINDINGS: Moderate L5-S1 disc height loss with mild endplate spurring compatible with degenerative disc. Remaining disc heights preserved. No destructive bony lesions. Phleboliths in the pelvis. Prevertebral and paraspinal soft tissue planes are nonsuspicious. IMPRESSION: Moderate L5-S1 degenerative disc. No acute fracture deformity or malalignment. Electronically Signed   By: Elon Alas M.D.   On: 10/18/2017 15:00   Dg Spine Portable 1 View  Result Date: 10/21/2017 CLINICAL DATA:  Intraoperative localization EXAM: PORTABLE SPINE - 1 VIEW COMPARISON:  Films from earlier in the same day FINDINGS: Surgical retractors in instruments are noted at the L5-S1 level. Numbering nomenclature similar to that utilized on prior examination. IMPRESSION: Intraoperative localization at L5-S1. Electronically  Signed   By: Inez Catalina M.D.   On: 10/21/2017 15:33   Dg Spine Portable 1 View  Result Date: 10/21/2017 CLINICAL DATA:  Intraoperative localization. EXAM: PORTABLE SPINE - 1 VIEW COMPARISON:  Intraoperative lateral view of the lumbar spine image labeled number 3 FINDINGS: Cross-table lateral view labeled "image number 4" of the lower lumbar spine was acquired. Retractors and probes project over the posterior elements at L5-S1. Disc space narrowing is seen at L5-S1 with small posterior marginal osteophytes. IMPRESSION: Surgical instruments are seen overlying the posterior elements and soft tissues at the level of L5-S1. Electronically Signed   By:  Ashley Royalty M.D.   On: 10/21/2017 15:10   Dg Spine Portable 1 View  Result Date: 10/21/2017 CLINICAL DATA:  Intraoperative localization EXAM: PORTABLE SPINE - 1 VIEW COMPARISON:  Film from earlier in the same day as well as 10/18/2017 FINDINGS: Surgical instruments are now seen posterior to the L5-S1 interspace. The numbering nomenclature is similar to that utilized on prior examinations. No other focal abnormality is seen. IMPRESSION: Intraoperative localization at L5-S1. Electronically Signed   By: Inez Catalina M.D.   On: 10/21/2017 14:49   Dg Spine Portable 1 View  Result Date: 10/21/2017 CLINICAL DATA:  Intraoperative lumbar spine localization, image #2 EXAM: PORTABLE SPINE - 1 VIEW COMPARISON:  10/21/2017 at 1408 hours FINDINGS: Lateral portable radiograph shows 2 surgical probes with tips posterior to the L5 pedicle. IMPRESSION: 1. Surgical probe localization with surgical probes posterior to the L5 pedicle. Electronically Signed   By: Kerby Moors M.D.   On: 10/21/2017 14:47   Dg Spine Portable 1 View  Result Date: 10/21/2017 CLINICAL DATA:  Intraoperative localization for spine surgery. EXAM: PORTABLE SPINE - 1 VIEW COMPARISON:  Radiographs 10/18/2017 FINDINGS: There are 2 spinal needles posteriorly marking the S1 and S2 levels. IMPRESSION:  S1 and S2 marked intraoperatively. Electronically Signed   By: Marijo Sanes M.D.   On: 10/21/2017 14:27    Hospital Course: [name] is a 46 y.o. who was admitted to [facility] on Friday December 7 for pain mangement. They were brought to the operating room on 10/21/2017 and underwent Procedure(s): Decompressive lumbar laminectomy L5-S1 for spinal stenosis; Microdiscectomy at L5-S1 on right for recurrent disc herniation, and Foraminotomy for S1 root on right for foraminal stenosis.  Patient tolerated the procedure well and was later transferred to the recovery room and then to the orthopaedic floor for postoperative care.  They were given PO and IV analgesics for pain control following their surgery.  They were given 24 hours of postoperative antibiotics of  Anti-infectives (From admission, onward)   Start     Dose/Rate Route Frequency Ordered Stop   10/22/17 0100  vancomycin (VANCOCIN) IVPB 1000 mg/200 mL premix     1,000 mg 200 mL/hr over 60 Minutes Intravenous  Once 10/21/17 1809 10/22/17 0200   10/21/17 1424  polymyxin B 500,000 Units, bacitracin 50,000 Units in sodium chloride 0.9 % 500 mL irrigation  Status:  Discontinued       As needed 10/21/17 1440 10/21/17 1600   10/21/17 1229  vancomycin (VANCOCIN) 1-5 GM/200ML-% IVPB    Comments:  Bridget Hartshorn   : cabinet override      10/21/17 1229 10/21/17 1305   10/21/17 1215  vancomycin (VANCOCIN) IVPB 1000 mg/200 mL premix     1,000 mg 200 mL/hr over 60 Minutes Intravenous On call to O.R. 10/18/17 1925 10/21/17 1405   10/20/17 0745  vancomycin (VANCOCIN) IVPB 1000 mg/200 mL premix  Status:  Discontinued     1,000 mg 200 mL/hr over 60 Minutes Intravenous  Once 10/20/17 0739 10/20/17 0820   10/19/17 0600  vancomycin (VANCOCIN) IVPB 1000 mg/200 mL premix  Status:  Discontinued     1,000 mg 200 mL/hr over 60 Minutes Intravenous On call to O.R. 10/18/17 1748 10/18/17 1925    PT was ordered to ambulate the patient. Discharge  planning consulted to help with postop disposition and equipment needs.  Patient had a good night on the evening of surgery.  They started to get up OOB with therapy on day one.  Dressing was changed and the incision was clean and dry.  The patient had progressed with therapy and meeting their goals.  Incision was healing well.  Patient was seen in rounds and was ready to go home.   Diet: Regular diet Activity:WBAT Follow-up:in 2 weeks Disposition - Home Discharged Condition: stable   Discharge Instructions    Call MD / Call 911   Complete by:  As directed    If you experience chest pain or shortness of breath, CALL 911 and be transported to the hospital emergency room.  If you develope a fever above 101 F, pus (white drainage) or increased drainage or redness at the wound, or calf pain, call your surgeon's office.   Constipation Prevention   Complete by:  As directed    Drink plenty of fluids.  Prune juice may be helpful.  You may use a stool softener, such as Colace (over the counter) 100 mg twice a day.  Use MiraLax (over the counter) for constipation as needed.   Diet - low sodium heart healthy   Complete by:  As directed    Discharge instructions   Complete by:  As directed    For the first three days, remove your dressing, and tape a piece of saran wrap over your incision Take your shower, then remove the saran wrap and put a clean dressing on After three days you can shower without the saran wrap.  No lifting or excessive bending No driving while taking pain medications Call Dr. Gladstone Lighter if any wound complications or temperature of 101 degrees F or over.  Call the office for an appointment to see Dr. Gladstone Lighter in two weeks: [phone] and ask for Dr. Charlestine Night nurse, Brunilda Payor.   Increase activity slowly as tolerated   Complete by:  As directed      Allergies as of 10/22/2017      Reactions   Amoxicillin Hives   Has patient had a PCN reaction causing immediate rash,  facial/tongue/throat swelling, SOB or lightheadedness with hypotension: Unknown Has patient had a PCN reaction causing severe rash involving mucus membranes or skin necrosis: Yes Has patient had a PCN reaction that required hospitalization: No Has patient had a PCN reaction occurring within the last 10 years: No If all of the above answers are "NO", then may proceed with Cephalosporin use.   Dilaudid [hydromorphone Hcl]    "starts seeing things"   Penicillins Hives   Has patient had a PCN reaction causing immediate rash, facial/tongue/throat swelling, SOB or lightheadedness with hypotension: Unknown Has patient had a PCN reaction causing severe rash involving mucus membranes or skin necrosis: Yes Has patient had a PCN reaction that required hospitalization: No Has patient had a PCN reaction occurring within the last 10 years: No If all of the above answers are "NO", then may proceed with Cephalosporin use.   Sulfa Antibiotics Hives      Medication List    TAKE these medications   acetaminophen 500 MG tablet Commonly known as:  TYLENOL Take 1,000 mg by mouth every 6 (six) hours as needed for mild pain, moderate pain, fever or headache.   albuterol 108 (90 Base) MCG/ACT inhaler Commonly known as:  PROVENTIL HFA;VENTOLIN HFA Inhale 1-2 puffs into the lungs every 6 (six) hours as needed for wheezing or shortness of breath.   CAMILA 0.35 MG tablet Generic drug:  norethindrone Take 1 tablet by mouth daily.   HYDROcodone-acetaminophen 5-325 MG tablet Commonly known as:  NORCO/VICODIN Take 1-2 tablets by  mouth every 6 (six) hours as needed for moderate pain. What changed:  how much to take   ibuprofen 200 MG tablet Commonly known as:  ADVIL,MOTRIN Take 200-400 mg by mouth every 6 (six) hours as needed for fever, headache, mild pain, moderate pain or cramping.   methocarbamol 500 MG tablet Commonly known as:  ROBAXIN Take 1 tablet (500 mg total) by mouth every 8 (eight) hours as  needed for muscle spasms.      Follow-up Information    Latanya Maudlin, MD. Schedule an appointment as soon as possible for a visit in 2 week(s).   Specialty:  Orthopedic Surgery Contact information: [address] [place] [address] [phone]           Signed: Ardeen Jourdain, PA-C Orthopaedic Surgery 10/22/2017, 12:37 PM

## 2017-10-23 MED ORDER — SUGAMMADEX SODIUM 200 MG/2ML IV SOLN
INTRAVENOUS | Status: DC | PRN
  Administered 2017-10-21: 150 mg via INTRAVENOUS

## 2017-10-23 NOTE — Addendum Note (Signed)
Addendum  created 10/23/17 1337 by Lollie Sails, CRNA   Intraprocedure Event edited, Intraprocedure Meds edited

## 2017-11-12 DIAGNOSIS — J189 Pneumonia, unspecified organism: Secondary | ICD-10-CM

## 2017-11-12 HISTORY — DX: Pneumonia, unspecified organism: J18.9

## 2018-12-31 IMAGING — DX DG LUMBAR SPINE 2-3V
2 series · 2 of 2 positions shown · non-contrast
Comparison: Lumbar spine radiographs February 19, 2013

CLINICAL DATA: Preoperative evaluation herniated disc.

EXAM:
LUMBAR SPINE - 2-3 VIEW

[l-spine ap]
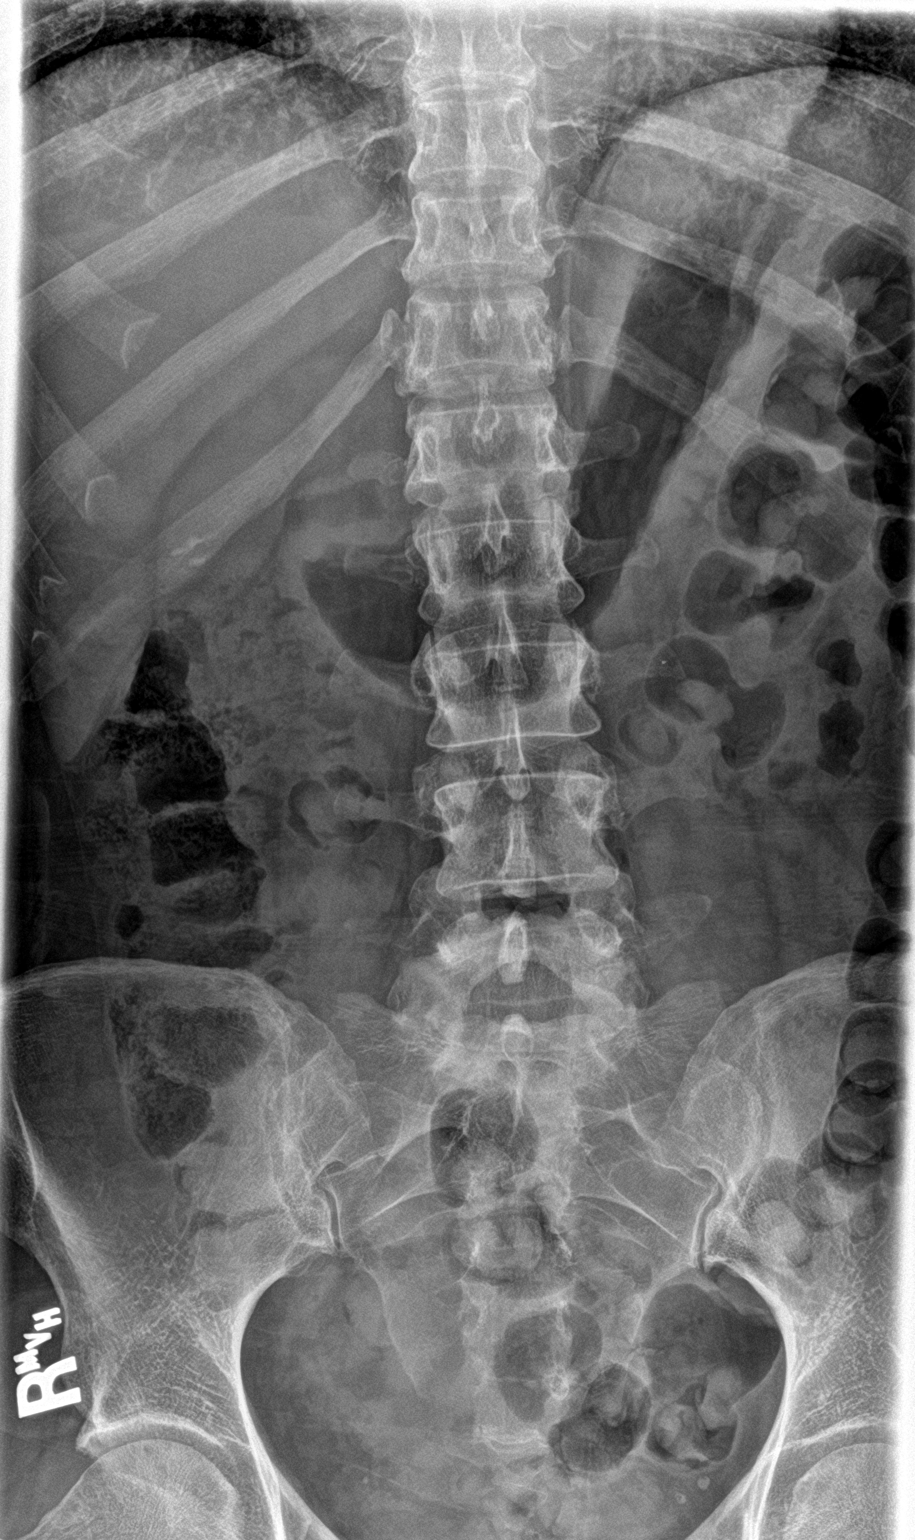

[l-spine lat]
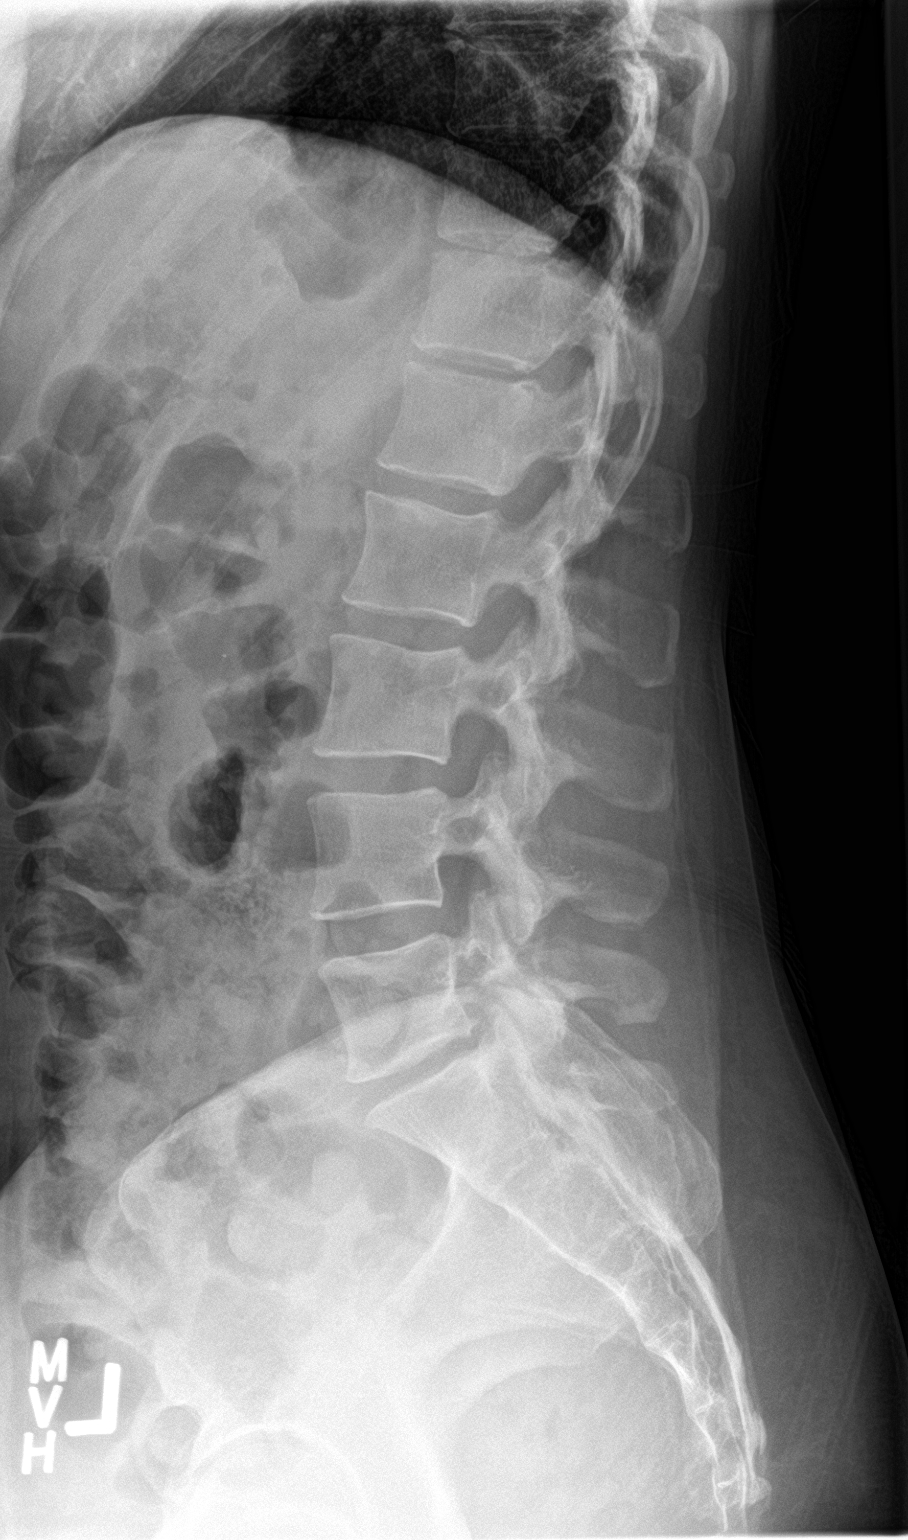

[2 of 2 positions shown; findings below may reference images not displayed]

FINDINGS: Moderate L5-S1 disc height loss with mild endplate spurring
compatible with degenerative disc. Remaining disc heights preserved.
No destructive bony lesions. Phleboliths in the pelvis. Prevertebral
and paraspinal soft tissue planes are nonsuspicious.
IMPRESSION: Moderate L5-S1 degenerative disc. No acute fracture deformity or
malalignment.

## 2021-01-26 ENCOUNTER — Telehealth: Payer: Self-pay | Admitting: Gastroenterology

## 2021-01-26 NOTE — Telephone Encounter (Signed)
Hey Dr Fuller Plan, this former pt of yours is requesting to switch care from you to Dr Hilarie Fredrickson since her mother sees him as a provider. Would this switch be ok?

## 2021-01-27 NOTE — Telephone Encounter (Signed)
[place] if ok with Dr, Fuller Plan

## 2021-01-28 NOTE — Telephone Encounter (Signed)
OK to transfer per patient request 

## 2021-07-27 DIAGNOSIS — J329 Chronic sinusitis, unspecified: Secondary | ICD-10-CM

## 2021-07-27 HISTORY — DX: Chronic sinusitis, unspecified: J32.9

## 2021-07-28 ENCOUNTER — Encounter (HOSPITAL_BASED_OUTPATIENT_CLINIC_OR_DEPARTMENT_OTHER): Payer: Self-pay | Admitting: Obstetrics and Gynecology

## 2021-07-28 ENCOUNTER — Other Ambulatory Visit: Payer: Self-pay

## 2021-07-28 DIAGNOSIS — Z01812 Encounter for preprocedural laboratory examination: Secondary | ICD-10-CM | POA: Diagnosis present

## 2021-07-28 DIAGNOSIS — N393 Stress incontinence (female) (male): Secondary | ICD-10-CM

## 2021-07-28 HISTORY — DX: Stress incontinence (female) (male): N39.3

## 2021-07-28 NOTE — Progress Notes (Signed)
YOU ARE SCHEDULED FOR A COVID TEST ON   08-07-2021  . THIS TEST MUST BE DONE BEFORE SURGERY. GO TO  [facility] @ [facility]   PHONE NUMBER [phone].   TURN LEFT AT THE SHIPPING AND RECEIVING SIGN AND LOOK FOR SMALL BLUE POP UP TENT UNDER BUILDING OVERHANG AT BACK OF BUILDING AND REMAIN IN YOUR CAR, THIS IS A DRIVE UP TEST.  AFTER YOUR COVID TEST , PLEASE WEAR A MASK OUT IN PUBLIC AND SOCIAL DISTANCE AND [place] YOUR HANDS FREQUENTLY. PLEASE ASK ALL YOUR CLOSE HOUSEHOLD CONTACT TO WEAR MASK OUT IN PUBLIC AND SOCIAL DISTANCE AND [place] HANDS FREQUENTLY ALSO.      Your procedure is scheduled on 08-10-2021  Report to [facility].   Call this number if you have problems the morning of surgery  :[phone].   OUR ADDRESS IS [place].  WE ARE LOCATED IN [facility].  PLEASE BRING YOUR INSURANCE CARD AND PHOTO ID DAY OF SURGERY.  ONLY ONE PERSON ALLOWED IN FACILITY WAITING AREA.                                     REMEMBER:  DO NOT EAT FOOD, CANDY GUM OR MINTS  AFTER MIDNIGHT . YOU MAY HAVE CLEAR LIQUIDS FROM MIDNIGHT UNTIL 430 AM. NO CLEAR LIQUIDS AFTER  430 AM DAY OF SURGERY.   YOU MAY  BRUSH YOUR TEETH MORNING OF SURGERY AND RINSE YOUR MOUTH OUT, NO CHEWING GUM CANDY OR MINTS.    CLEAR LIQUID DIET   Foods Allowed                                                                     Foods Excluded  Coffee and tea, regular and decaf                             liquids that you cannot  Plain Jell-O any favor except red or purple                                           see through such as: Fruit ices (not with fruit pulp)                                     milk, soups, orange juice  Iced Popsicles                                    All solid food Carbonated beverages, regular and diet                                    Cranberry, grape and apple juices Sports drinks like Gatorade Lightly  seasoned clear  broth or consume(fat free) Sugar  Sample Menu Breakfast                                Lunch                                     Supper Cranberry juice                    Beef broth                            Chicken broth Jell-O                                     Grape juice                           Apple juice Coffee or tea                        Jell-O                                      Popsicle                                                Coffee or tea                        Coffee or tea  _____________________________________________________________________     TAKE THESE MEDICATIONS MORNING OF SURGERY WITH A SIP OF WATER:  PEPCID  ONE VISITOR IS ALLOWED IN WAITING ROOM ONLY DAY OF SURGERY.  NO VISITOR MAY SPEND THE NIGHT.  VISITOR ARE ALLOWED TO STAY UNTIL 800 PM.                                    DO NOT WEAR JEWERLY, MAKE UP. DO NOT WEAR LOTIONS, POWDERS, PERFUMES OR NAIL POLISH. DO NOT SHAVE FOR 48 HOURS PRIOR TO DAY OF SURGERY. MEN MAY SHAVE FACE AND NECK. CONTACTS, GLASSES, OR DENTURES MAY NOT BE WORN TO SURGERY.                                    [facility] IS NOT RESPONSIBLE  FOR ANY BELONGINGS.                                                                    Marland Kitchen           [facility] - Preparing for Surgery Before surgery, you can play an important role.  Because  skin is not sterile, your skin needs to be as free of germs as possible.  You can reduce the number of germs on your skin by washing with CHG (chlorahexidine gluconate) soap before surgery.  CHG is an antiseptic cleaner which kills germs and bonds with the skin to continue killing germs even after washing. Please DO NOT use if you have an allergy to CHG or antibacterial soaps.  If your skin becomes reddened/irritated stop using the CHG and inform your nurse when you arrive at Short Stay. Do not shave (including legs and underarms) for at least 48 hours prior to the first CHG shower.  You may  shave your face/neck. Please follow these instructions carefully:  1.  Shower with CHG Soap the night before surgery and the  morning of Surgery.  2.  If you choose to wash your hair, wash your hair first as usual with your  normal  shampoo.  3.  After you shampoo, rinse your hair and body thoroughly to remove the  shampoo.                            4.  Use CHG as you would any other liquid soap.  You can apply chg directly  to the skin and wash                      Gently with a scrungie or clean washcloth.  5.  Apply the CHG Soap to your body ONLY FROM THE NECK DOWN.   Do not use on face/ open                           Wound or open sores. Avoid contact with eyes, ears mouth and genitals (private parts).                       Wash face,  Genitals (private parts) with your normal soap.             6.  Wash thoroughly, paying special attention to the area where your surgery  will be performed.  7.  Thoroughly rinse your body with warm water from the neck down.  8.  DO NOT shower/wash with your normal soap after using and rinsing off  the CHG Soap.                9.  Pat yourself dry with a clean towel.            10.  Wear clean pajamas.            11.  Place clean sheets on your bed the night of your first shower and do not  sleep with pets. Day of Surgery : Do not apply any lotions/deodorants the morning of surgery.  Please wear clean clothes to the hospital/surgery center.  FAILURE TO FOLLOW THESE INSTRUCTIONS MAY RESULT IN THE CANCELLATION OF YOUR SURGERY PATIENT SIGNATURE_________________________________  NURSE SIGNATURE__________________________________  ________________________________________________________________________                                                        QUESTIONS [name] PRE OP NURSE PHONE [phone].

## 2021-07-28 NOTE — Progress Notes (Signed)
Spoke w/ via phone for pre-op interview---pt Lab needs dos----urine preg               Lab results------lab appt 08-07-2021 for cbc t & s COVID test -----08-07-2021 extended recovery Arrive at -------530 am 08-10-2021 NPO after MN NO Solid Food.  Clear liquids from MN until---430 am Med rec completed Medications to take morning of surgery -----pepcid Diabetic medication -----n/a Patient instructed no nail polish to be worn day of surgery Patient instructed to bring photo id and insurance card day of surgery Patient aware to have Driver (ride ) / caregiver  spouse michael   for 24 hours after surgery  Patient Special Instructions -----pt given extended recovery instructions Pre-Op special Istructions -----none Patient verbalized understanding of instructions that were given at this phone interview. Patient denies shortness of breath, chest pain, fever, cough at this phone interview.

## 2021-08-07 ENCOUNTER — Other Ambulatory Visit: Payer: Self-pay

## 2021-08-07 ENCOUNTER — Encounter (HOSPITAL_COMMUNITY)
Admission: RE | Admit: 2021-08-07 | Discharge: 2021-08-07 | Disposition: A | Discharge status: Home / Self Care | Payer: Managed Care, Other (non HMO) | Source: Ambulatory Visit | Attending: Obstetrics and Gynecology | Admitting: Obstetrics and Gynecology

## 2021-08-07 ENCOUNTER — Other Ambulatory Visit: Payer: Self-pay | Admitting: Obstetrics and Gynecology

## 2021-08-07 DIAGNOSIS — Z01812 Encounter for preprocedural laboratory examination: Secondary | ICD-10-CM | POA: Insufficient documentation

## 2021-08-07 LAB — CBC
HCT: 44.9 % (ref 36.0–46.0)
Hemoglobin: 14.7 g/dL (ref 12.0–15.0)
MCH: 30.9 pg (ref 26.0–34.0)
MCHC: 32.7 g/dL (ref 30.0–36.0)
MCV: 94.5 fL (ref 80.0–100.0)
Platelets: 322 10*3/uL (ref 150–400)
RBC: 4.75 MIL/uL (ref 3.87–5.11)
RDW: 11.9 % (ref 11.5–15.5)
WBC: 8.6 10*3/uL (ref 4.0–10.5)
nRBC: 0 % (ref 0.0–0.2)

## 2021-08-07 LAB — SARS CORONAVIRUS 2 (TAT 6-24 HRS): SARS Coronavirus 2: NEGATIVE

## 2021-08-09 NOTE — H&P (Signed)
[name] is an 50 y.o. female. She was seen for annual exam in June, occasional light bleeding with POP, no problems.  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  Options were discussed.  She started having back pain, myomas could be contributing, so she wants definitive surgical treatment.  On her exam, she also had a Gr II rectocele, and leaks urine some with strain  Pertinent Gynecological History: Last mammogram: normal Date: 04/2021 Last pap: normal Date: 04/2021 OB History: G2, P2002 SVD x 2   Menstrual History: Patient's last menstrual period was 07/12/2021 (approximate).    Past Medical History:  Diagnosis Date   Back pain    GERD (gastroesophageal reflux disease)    History of kidney stones    Pneumonia 2019   Sinus infection 07/27/2021   started z pack 07-27-2021 rapid home covid test negative   Stomach ulcer    SUI (stress urinary incontinence, female) 07/28/2021   Wears glasses     Past Surgical History:  Procedure Laterality Date   LUMBAR LAMINECTOMY Right 02/20/2013   Procedure: MICRODISCECTOMY LUMBAR LAMINECTOMY;  Surgeon: Tobi Bastos, MD;  Location: [facility];  Service: Orthopedics;  Laterality: Right;  Dr. Tonita Cong will assist.    LUMBAR LAMINECTOMY/DECOMPRESSION MICRODISCECTOMY N/A 10/21/2017   Procedure: Decompressive lumbar laminectomy L5-S1 for spinal stenosis; Microdiscectomy at L5-S1 on right for recurrent disc herniation, and Foraminotomy for S1 root on right for foraminal stenosis;  Surgeon: Latanya Maudlin, MD;  Location: [facility];  Service: Orthopedics;  Laterality: N/A;    History reviewed. No pertinent family history.  Social History:  reports that she has never smoked. She has never used smokeless tobacco. She reports current alcohol use. She reports that she does not use drugs.  Allergies:  Allergies  Allergen Reactions   Amoxicillin Hives    Has patient had a PCN  reaction causing immediate rash, facial/tongue/throat swelling, SOB or lightheadedness with hypotension: Unknown Has patient had a PCN reaction causing severe rash involving mucus membranes or skin necrosis: Yes Has patient had a PCN reaction that required hospitalization: No Has patient had a PCN reaction occurring within the last 10 years: No If all of the above answers are "NO", then may proceed with Cephalosporin use.    Dilaudid [Hydromorphone Hcl]     "starts seeing things"   Penicillins Hives    Has patient had a PCN reaction causing immediate rash, facial/tongue/throat swelling, SOB or lightheadedness with hypotension: Unknown Has patient had a PCN reaction causing severe rash involving mucus membranes or skin necrosis: Yes Has patient had a PCN reaction that required hospitalization: No Has patient had a PCN reaction occurring within the last 10 years: No If all of the above answers are "NO", then may proceed with Cephalosporin use.    Sulfa Antibiotics Hives    No medications prior to admission.    Review of Systems  Respiratory: Negative.    Cardiovascular: Negative.    Height 5' (1.524 m), weight 62.6 kg, last menstrual period 07/12/2021. Physical Exam Constitutional:      Appearance: Normal appearance.  Cardiovascular:     Rate and Rhythm: Normal rate and regular rhythm.     Heart sounds: Normal heart sounds. No murmur heard. Pulmonary:     Effort: Pulmonary effort is normal. No respiratory distress.     Breath sounds: Normal breath sounds.  Abdominal:     General: There is no distension.  Palpations: Abdomen is soft. There is no mass.     Tenderness: There is no abdominal tenderness.  Genitourinary:    General: Normal vulva.     Comments: Uterus 10-12 weeks size, NT No adnexal mass Gr II rectocele Musculoskeletal:     Cervical back: Normal range of motion and neck supple.  Neurological:     Mental Status: She is alert.    No results found for this or  any previous visit (from the past 24 hour(s)).  No results found.  Assessment/Plan: Symptomatic myomatous uterus, rectocele, stress incontinence.  All medical and surgical options have been discussed.  She wants definitive surgical therapy.  Surgical procedure, options, risks, chances of relieving symptoms have all been discussed, questions answered.  Will admit for TVH, posterior repair, possible bilateral salpingectomy, possible anterior repair, possible Solyx sling.  She will be deciding about sling on day of surgery  Blane Ohara [name] 08/09/2021, 9:08 PM

## 2021-08-09 NOTE — Anesthesia Preprocedure Evaluation (Addendum)
Anesthesia Evaluation  Patient identified by MRN, date of birth, ID band Patient awake    Reviewed: Allergy & Precautions, NPO status , Patient's Chart, lab work & pertinent test results  Airway Mallampati: I  TM Distance: >3 FB Neck ROM: Full    Dental no notable dental hx.    Pulmonary  Sinus infection    Pulmonary exam normal breath sounds clear to auscultation       Cardiovascular Exercise Tolerance: Good negative cardio ROS Normal cardiovascular exam Rhythm:Regular Rate:Normal     Neuro/Psych  Neuromuscular disease (lumbar radiculopathy s/p surgery)    GI/Hepatic Neg liver ROS, PUD, GERD  ,  Endo/Other  negative endocrine ROS  Renal/GU negative Renal ROS     Musculoskeletal   Abdominal   Peds negative pediatric ROS (+)  Hematology negative hematology ROS (+)   Anesthesia Other Findings   Reproductive/Obstetrics                            Anesthesia Physical Anesthesia Plan  ASA: 2  Anesthesia Plan: General   Post-op Pain Management:    Induction: Intravenous  PONV Risk Score and Plan: 3 and Treatment may vary due to age or medical condition, Scopolamine patch - Pre-op, Midazolam, Ondansetron and Dexamethasone  Airway Management Planned: Oral ETT  Additional Equipment: None  Intra-op Plan:   Post-operative Plan: Extubation in OR  Informed Consent: I have reviewed the patients History and Physical, chart, labs and discussed the procedure including the risks, benefits and alternatives for the proposed anesthesia with the patient or authorized representative who has indicated his/her understanding and acceptance.     Dental advisory given  Plan Discussed with: Anesthesiologist, Surgeon and CRNA  Anesthesia Plan Comments: (Patient had burning and redness of the forehead after BIS placement with a previous surgery. Will avoid today. Patient states she tolerates morphine  and fentanyl fine. Norton Blizzard, MD  )       Anesthesia Quick Evaluation

## 2021-08-10 ENCOUNTER — Ambulatory Visit (HOSPITAL_BASED_OUTPATIENT_CLINIC_OR_DEPARTMENT_OTHER)
Admission: RE | Admit: 2021-08-10 | Discharge: 2021-08-11 | Disposition: A | Discharge status: Home / Self Care | Payer: Managed Care, Other (non HMO) | Attending: Obstetrics and Gynecology | Admitting: Obstetrics and Gynecology

## 2021-08-10 ENCOUNTER — Encounter (HOSPITAL_BASED_OUTPATIENT_CLINIC_OR_DEPARTMENT_OTHER): Payer: Self-pay | Admitting: Obstetrics and Gynecology

## 2021-08-10 ENCOUNTER — Encounter (HOSPITAL_BASED_OUTPATIENT_CLINIC_OR_DEPARTMENT_OTHER)
Admission: RE | Disposition: A | Discharge status: Home / Self Care | Payer: Self-pay | Source: Home / Self Care | Attending: Obstetrics and Gynecology

## 2021-08-10 ENCOUNTER — Ambulatory Visit (HOSPITAL_BASED_OUTPATIENT_CLINIC_OR_DEPARTMENT_OTHER): Payer: Managed Care, Other (non HMO) | Admitting: Anesthesiology

## 2021-08-10 DIAGNOSIS — Z880 Allergy status to penicillin: Secondary | ICD-10-CM | POA: Diagnosis not present

## 2021-08-10 DIAGNOSIS — N393 Stress incontinence (female) (male): Secondary | ICD-10-CM | POA: Diagnosis not present

## 2021-08-10 DIAGNOSIS — Z885 Allergy status to narcotic agent status: Secondary | ICD-10-CM | POA: Insufficient documentation

## 2021-08-10 DIAGNOSIS — D259 Leiomyoma of uterus, unspecified: Secondary | ICD-10-CM | POA: Diagnosis present

## 2021-08-10 DIAGNOSIS — Z882 Allergy status to sulfonamides status: Secondary | ICD-10-CM | POA: Insufficient documentation

## 2021-08-10 DIAGNOSIS — N816 Rectocele: Secondary | ICD-10-CM | POA: Insufficient documentation

## 2021-08-10 DIAGNOSIS — Z90710 Acquired absence of both cervix and uterus: Secondary | ICD-10-CM | POA: Diagnosis present

## 2021-08-10 DIAGNOSIS — N8110 Cystocele, unspecified: Secondary | ICD-10-CM | POA: Diagnosis not present

## 2021-08-10 HISTORY — PX: VAGINAL HYSTERECTOMY: SHX2639

## 2021-08-10 HISTORY — PX: RECTOCELE REPAIR: SHX761

## 2021-08-10 HISTORY — DX: Personal history of urinary calculi: Z87.442

## 2021-08-10 HISTORY — DX: Presence of spectacles and contact lenses: Z97.3

## 2021-08-10 HISTORY — DX: Gastro-esophageal reflux disease without esophagitis: K21.9

## 2021-08-10 LAB — TYPE AND SCREEN
ABO/RH(D): B POS
Antibody Screen: NEGATIVE

## 2021-08-10 LAB — ABO/RH: ABO/RH(D): B POS

## 2021-08-10 LAB — POCT PREGNANCY, URINE: Preg Test, Ur: NEGATIVE

## 2021-08-10 SURGERY — HYSTERECTOMY, VAGINAL
Anesthesia: General | Site: Uterus

## 2021-08-10 MED ORDER — BISACODYL 10 MG RE SUPP: 10.0000 mg | Freq: Every day | RECTAL | Status: DC | PRN

## 2021-08-10 MED ORDER — ACETAMINOPHEN 500 MG PO TABS
1000.0000 mg | ORAL_TABLET | Freq: Four times a day (QID) | ORAL | Status: DC
  Administered 2021-08-10 – 2021-08-11 (×4): 1000 mg via ORAL

## 2021-08-10 MED ORDER — PROMETHAZINE HCL 25 MG/ML IJ SOLN: 6.2500 mg | INTRAMUSCULAR | Status: DC | PRN

## 2021-08-10 MED ORDER — ROCURONIUM BROMIDE 10 MG/ML (PF) SYRINGE
PREFILLED_SYRINGE | INTRAVENOUS | Status: DC | PRN
  Administered 2021-08-10: 50 mg via INTRAVENOUS
  Administered 2021-08-10: 10 mg via INTRAVENOUS

## 2021-08-10 MED ORDER — OXYCODONE HCL 5 MG PO TABS
ORAL_TABLET | ORAL | Status: AC
  Filled 2021-08-10: qty 2

## 2021-08-10 MED ORDER — ONDANSETRON HCL 4 MG/2ML IJ SOLN
4.0000 mg | Freq: Four times a day (QID) | INTRAMUSCULAR | Status: DC | PRN
  Administered 2021-08-10: 4 mg via INTRAVENOUS

## 2021-08-10 MED ORDER — GABAPENTIN 300 MG PO CAPS
ORAL_CAPSULE | ORAL | Status: AC
  Filled 2021-08-10: qty 1

## 2021-08-10 MED ORDER — FENTANYL CITRATE (PF) 100 MCG/2ML IJ SOLN
INTRAMUSCULAR | Status: AC
  Filled 2021-08-10: qty 2

## 2021-08-10 MED ORDER — KETOROLAC TROMETHAMINE 30 MG/ML IJ SOLN
INTRAMUSCULAR | Status: AC
  Filled 2021-08-10: qty 1

## 2021-08-10 MED ORDER — ONDANSETRON HCL 4 MG/2ML IJ SOLN
INTRAMUSCULAR | Status: AC
  Filled 2021-08-10: qty 2

## 2021-08-10 MED ORDER — PROPOFOL 10 MG/ML IV BOLUS
INTRAVENOUS | Status: AC
  Filled 2021-08-10: qty 20

## 2021-08-10 MED ORDER — ONDANSETRON HCL 4 MG/2ML IJ SOLN
INTRAMUSCULAR | Status: DC | PRN
  Administered 2021-08-10: 4 mg via INTRAVENOUS

## 2021-08-10 MED ORDER — ACETAMINOPHEN 500 MG PO TABS
ORAL_TABLET | ORAL | Status: AC
  Filled 2021-08-10: qty 2

## 2021-08-10 MED ORDER — SUGAMMADEX SODIUM 200 MG/2ML IV SOLN
INTRAVENOUS | Status: DC | PRN
  Administered 2021-08-10: 100 mg via INTRAVENOUS

## 2021-08-10 MED ORDER — SCOPOLAMINE 1 MG/3DAYS TD PT72
MEDICATED_PATCH | TRANSDERMAL | Status: AC
  Filled 2021-08-10: qty 1

## 2021-08-10 MED ORDER — FENTANYL CITRATE (PF) 250 MCG/5ML IJ SOLN
INTRAMUSCULAR | Status: DC | PRN
  Administered 2021-08-10 (×2): 25 ug via INTRAVENOUS
  Administered 2021-08-10: 50 ug via INTRAVENOUS

## 2021-08-10 MED ORDER — VASOPRESSIN 20 UNIT/ML IV SOLN
INTRAVENOUS | Status: DC | PRN
  Administered 2021-08-10: .1 mL via INTRAMUSCULAR

## 2021-08-10 MED ORDER — FAMOTIDINE 20 MG PO TABS: 20.0000 mg | ORAL_TABLET | ORAL | Status: DC | PRN

## 2021-08-10 MED ORDER — CEFAZOLIN SODIUM-DEXTROSE 2-4 GM/100ML-% IV SOLN
INTRAVENOUS | Status: AC
  Filled 2021-08-10: qty 100

## 2021-08-10 MED ORDER — KETOROLAC TROMETHAMINE 30 MG/ML IJ SOLN
30.0000 mg | Freq: Once | INTRAMUSCULAR | Status: AC
  Administered 2021-08-10: 30 mg via INTRAVENOUS

## 2021-08-10 MED ORDER — KETOROLAC TROMETHAMINE 30 MG/ML IJ SOLN
30.0000 mg | Freq: Four times a day (QID) | INTRAMUSCULAR | Status: DC
  Administered 2021-08-10 – 2021-08-11 (×3): 30 mg via INTRAVENOUS

## 2021-08-10 MED ORDER — BUPIVACAINE HCL (PF) 0.5 % IJ SOLN
INTRAMUSCULAR | Status: DC | PRN
  Administered 2021-08-10: 7.4 mL

## 2021-08-10 MED ORDER — KETAMINE HCL 50 MG/5ML IJ SOSY
PREFILLED_SYRINGE | INTRAMUSCULAR | Status: AC
  Filled 2021-08-10: qty 5

## 2021-08-10 MED ORDER — LIDOCAINE 20MG/ML (2%) 15 ML SYRINGE OPTIME
INTRAMUSCULAR | Status: DC | PRN
  Administered 2021-08-10: 1.5 mg/kg/h via INTRAVENOUS

## 2021-08-10 MED ORDER — FENTANYL CITRATE (PF) 100 MCG/2ML IJ SOLN
25.0000 ug | INTRAMUSCULAR | Status: DC | PRN
  Administered 2021-08-10 (×4): 25 ug via INTRAVENOUS

## 2021-08-10 MED ORDER — SODIUM CHLORIDE FLUSH 0.9 % IV SOLN
INTRAVENOUS | Status: DC | PRN
  Administered 2021-08-10: 12.5 mL via INTRAVENOUS

## 2021-08-10 MED ORDER — PROPOFOL 10 MG/ML IV BOLUS
INTRAVENOUS | Status: DC | PRN
  Administered 2021-08-10: 140 mg via INTRAVENOUS

## 2021-08-10 MED ORDER — LACTATED RINGERS IV SOLN: INTRAVENOUS | Status: DC

## 2021-08-10 MED ORDER — DEXAMETHASONE SODIUM PHOSPHATE 10 MG/ML IJ SOLN
INTRAMUSCULAR | Status: DC | PRN
  Administered 2021-08-10: 10 mg via INTRAVENOUS

## 2021-08-10 MED ORDER — LIDOCAINE 2% (20 MG/ML) 5 ML SYRINGE
INTRAMUSCULAR | Status: DC | PRN
  Administered 2021-08-10: 60 mg via INTRAVENOUS

## 2021-08-10 MED ORDER — SCOPOLAMINE 1 MG/3DAYS TD PT72
1.0000 | MEDICATED_PATCH | TRANSDERMAL | Status: DC
  Administered 2021-08-10: 1.5 mg via TRANSDERMAL

## 2021-08-10 MED ORDER — MIDAZOLAM HCL 5 MG/5ML IJ SOLN
INTRAMUSCULAR | Status: DC | PRN
Start: 2021-08-10 — End: 2021-08-10
  Administered 2021-08-10: 2 mg via INTRAVENOUS

## 2021-08-10 MED ORDER — SIMETHICONE 80 MG PO CHEW: 80.0000 mg | CHEWABLE_TABLET | Freq: Four times a day (QID) | ORAL | Status: DC | PRN

## 2021-08-10 MED ORDER — GABAPENTIN 300 MG PO CAPS
300.0000 mg | ORAL_CAPSULE | Freq: Three times a day (TID) | ORAL | Status: DC
  Administered 2021-08-10 (×2): 300 mg via ORAL

## 2021-08-10 MED ORDER — LIDOCAINE HCL 2 % IJ SOLN
INTRAMUSCULAR | Status: AC
  Filled 2021-08-10: qty 20

## 2021-08-10 MED ORDER — MORPHINE SULFATE (PF) 2 MG/ML IV SOLN
INTRAVENOUS | Status: AC
  Filled 2021-08-10: qty 1

## 2021-08-10 MED ORDER — DEXAMETHASONE SODIUM PHOSPHATE 10 MG/ML IJ SOLN
INTRAMUSCULAR | Status: AC
  Filled 2021-08-10: qty 1

## 2021-08-10 MED ORDER — DROPERIDOL 2.5 MG/ML IJ SOLN: 0.6250 mg | Freq: Once | INTRAMUSCULAR | Status: DC | PRN

## 2021-08-10 MED ORDER — ROCURONIUM BROMIDE 10 MG/ML (PF) SYRINGE
PREFILLED_SYRINGE | INTRAVENOUS | Status: AC
  Filled 2021-08-10: qty 10

## 2021-08-10 MED ORDER — OXYCODONE HCL 5 MG/5ML PO SOLN: 5.0000 mg | Freq: Once | ORAL | Status: DC | PRN

## 2021-08-10 MED ORDER — ACETAMINOPHEN 500 MG PO TABS
1000.0000 mg | ORAL_TABLET | Freq: Once | ORAL | Status: AC
  Administered 2021-08-10: 1000 mg via ORAL

## 2021-08-10 MED ORDER — KETAMINE HCL 10 MG/ML IJ SOLN
INTRAMUSCULAR | Status: DC | PRN
  Administered 2021-08-10: 25 mg via INTRAVENOUS

## 2021-08-10 MED ORDER — CEFAZOLIN SODIUM-DEXTROSE 2-4 GM/100ML-% IV SOLN
2.0000 g | INTRAVENOUS | Status: AC
  Administered 2021-08-10: 2 g via INTRAVENOUS

## 2021-08-10 MED ORDER — DEXTROSE-NACL 5-0.45 % IV SOLN: INTRAVENOUS | Status: DC

## 2021-08-10 MED ORDER — ONDANSETRON HCL 4 MG PO TABS: 4.0000 mg | ORAL_TABLET | Freq: Four times a day (QID) | ORAL | Status: DC | PRN

## 2021-08-10 MED ORDER — OXYCODONE HCL 5 MG PO TABS
5.0000 mg | ORAL_TABLET | ORAL | Status: DC | PRN
  Administered 2021-08-10 – 2021-08-11 (×4): 10 mg via ORAL
  Administered 2021-08-11: 5 mg via ORAL

## 2021-08-10 MED ORDER — MORPHINE SULFATE (PF) 4 MG/ML IV SOLN
2.0000 mg | INTRAVENOUS | Status: DC | PRN
Start: 2021-08-10 — End: 2021-08-11
  Administered 2021-08-10: 2 mg via INTRAVENOUS

## 2021-08-10 MED ORDER — MENTHOL 3 MG MT LOZG: 1.0000 | LOZENGE | OROMUCOSAL | Status: DC | PRN

## 2021-08-10 MED ORDER — MIDAZOLAM HCL 2 MG/2ML IJ SOLN
INTRAMUSCULAR | Status: AC
  Filled 2021-08-10: qty 2

## 2021-08-10 MED ORDER — IBUPROFEN 200 MG PO TABS: 600.0000 mg | ORAL_TABLET | Freq: Four times a day (QID) | ORAL | Status: DC

## 2021-08-10 MED ORDER — ALUM & MAG HYDROXIDE-SIMETH 200-200-20 MG/5ML PO SUSP: 30.0000 mL | ORAL | Status: DC | PRN

## 2021-08-10 MED ORDER — OXYCODONE HCL 5 MG PO TABS: 5.0000 mg | ORAL_TABLET | Freq: Once | ORAL | Status: DC | PRN

## 2021-08-10 MED ORDER — SODIUM CHLORIDE 0.9 % IR SOLN
Status: DC | PRN
  Administered 2021-08-10: 1000 mL via INTRAVESICAL

## 2021-08-10 SURGICAL SUPPLY — 53 items
ADH SKN CLS APL DERMABOND .7 (GAUZE/BANDAGES/DRESSINGS)
BLADE SURG 10 STRL SS (BLADE) ×2 IMPLANT
BLADE SURG 15 STRL LF DISP TIS (BLADE) ×3 IMPLANT
BLADE SURG 15 STRL SS (BLADE) ×4
CATH ROBINSON RED A/P 16FR (CATHETERS) ×4 IMPLANT
CLOTH BEACON ORANGE TIMEOUT ST (SAFETY) ×4 IMPLANT
CNTNR URN SCR LID CUP LEK RST (MISCELLANEOUS) IMPLANT
CONT SPEC 4OZ STRL OR WHT (MISCELLANEOUS)
COVER MAYO STAND STRL (DRAPES) ×6 IMPLANT
DECANTER SPIKE VIAL GLASS SM (MISCELLANEOUS) IMPLANT
DERMABOND ADVANCED (GAUZE/BANDAGES/DRESSINGS)
DERMABOND ADVANCED .7 DNX12 (GAUZE/BANDAGES/DRESSINGS) IMPLANT
GAUZE 4X4 16PLY ~~LOC~~+RFID DBL (SPONGE) ×8 IMPLANT
GAUZE PACKING 2X5 YD STRL (GAUZE/BANDAGES/DRESSINGS) ×4 IMPLANT
GLOVE SRG 8 PF TXTR STRL LF DI (GLOVE) ×3 IMPLANT
GLOVE SURG ENC MOIS LTX SZ7.5 (GLOVE) ×2 IMPLANT
GLOVE SURG ENC MOIS LTX SZ8 (GLOVE) ×4 IMPLANT
GLOVE SURG ENC TEXT LTX SZ6.5 (GLOVE) ×2 IMPLANT
GLOVE SURG ORTHO LTX SZ7.5 (GLOVE) ×4 IMPLANT
GLOVE SURG UNDER POLY LF SZ6.5 (GLOVE) ×4 IMPLANT
GLOVE SURG UNDER POLY LF SZ7 (GLOVE) ×4 IMPLANT
GLOVE SURG UNDER POLY LF SZ8 (GLOVE) ×4
GOWN STRL REUS W/TWL LRG LVL3 (GOWN DISPOSABLE) ×12 IMPLANT
GOWN STRL REUS W/TWL XL LVL3 (GOWN DISPOSABLE) ×4 IMPLANT
KIT TURNOVER CYSTO (KITS) ×4 IMPLANT
NDL MAYO CATGUT SZ4 TPR NDL (NEEDLE) IMPLANT
NEEDLE HYPO 22GX1.5 SAFETY (NEEDLE) ×4 IMPLANT
NEEDLE MAYO CATGUT SZ4 (NEEDLE) IMPLANT
NS IRRIG 1000ML POUR BTL (IV SOLUTION) ×4 IMPLANT
NS IRRIG 500ML POUR BTL (IV SOLUTION) ×4 IMPLANT
PACK TRENDGUARD 450 HYBRID PRO (MISCELLANEOUS) IMPLANT
PACK VAGINAL WOMENS (CUSTOM PROCEDURE TRAY) ×4 IMPLANT
PAD OB MATERNITY 4.3X12.25 (PERSONAL CARE ITEMS) ×4 IMPLANT
SET IRRIG Y TYPE TUR BLADDER L (SET/KITS/TRAYS/PACK) ×4 IMPLANT
SHEARS FOC LG CVD HARMONIC 17C (MISCELLANEOUS) ×4 IMPLANT
SUT CHROMIC 1 TIES 18 (SUTURE) IMPLANT
SUT CHROMIC 1MO 4 18 CR8 (SUTURE) ×4 IMPLANT
SUT PROLENE 1 CTX 30  8455H (SUTURE)
SUT PROLENE 1 CTX 30 8455H (SUTURE) IMPLANT
SUT SILK 0 SH 30 (SUTURE) ×2 IMPLANT
SUT SILK 2 0 SH (SUTURE) ×4 IMPLANT
SUT VIC AB 2-0 CT1 (SUTURE) ×10 IMPLANT
SUT VIC AB 2-0 CT1 27 (SUTURE) ×4
SUT VIC AB 2-0 CT1 TAPERPNT 27 (SUTURE) ×3 IMPLANT
SUT VIC AB 2-0 CT2 27 (SUTURE) ×16 IMPLANT
SUT VIC AB 3-0 CT1 27 (SUTURE)
SUT VIC AB 3-0 CT1 TAPERPNT 27 (SUTURE) IMPLANT
SUT VIC AB 3-0 SH 27 (SUTURE)
SUT VIC AB 3-0 SH 27X BRD (SUTURE) IMPLANT
SUT VICRYL 0 UR6 27IN ABS (SUTURE) IMPLANT
TOWEL OR 17X26 10 PK STRL BLUE (TOWEL DISPOSABLE) ×8 IMPLANT
TRAY FOLEY W/BAG SLVR 14FR LF (SET/KITS/TRAYS/PACK) ×4 IMPLANT
TRENDGUARD 450 HYBRID PRO PACK (MISCELLANEOUS)

## 2021-08-10 NOTE — Op Note (Signed)
Preoperative diagnosis: Symptomatic myomatous uterus, rectocele Postop diagnosis: Same, small cystocele Procedure: Total vaginal hysterectomy, bilateral salpingectomy, anterior and posterior colporrhaphy, cystoscopy Surgeon: Cheri Fowler M.D. Assistant: Carlynn Purl, D.O. Anesthesia: Gen. Findings: Patient had an enlarged, irregular uterus with multiple myomas, normal tubes and ovaries. Via cystoscopy bladder was normal and both ureters were patent.  Assistance from Dr. Terri Piedra was necessary throughout the case to help expose pedicles and control bleeding Estimated blood loss: 150 cc Specimens: Morcellated uterus and tubes for routine pathology Complications: None   Procedure in detail: The patient was taken to the operating room and placed in the dorsosupine position. General anesthesia was induced and she was placed in mobile stirrups. Legs were elevated in the stirrups. Perineum and vagina were then prepped and draped in the usual sterile fashion, bladder drained with red Robinson catheter. A weighted speculum was inserted in the vagina and the cervix was grasped with Ardis Hughs tenaculum. Dilute Pitressin with Marcaine was then instilled at the cervicovaginal junction which was then incised circumferentially with electrocautery. Sharp dissection was then used to further free the vagina from the cervix. Anterior peritoneum was identified and entered sharply. A Deaver retractor was used to retract the bladder anteriorly. Posterior cul-de-sac was identified and entered sharply. A Bonnano speculum was placed into the posterior cul-de-sac. Uterosacral ligaments were clamped transected and ligated with #1 chromic and tagged for later use. The remaining pedicles were then taken down with harmonic scalpel.  The uterus was bulky due to myomas, so morcellation was carried out and the entire uterus removed.  Bleeding from pedicles on the right controlled with figure 8 sutures of #1 Chromic.   Harmonic scalpel was  the used to free the tubes, they were removed. Ovaries were inspected and found to be normal. Bleeding now from the left side was controlled with figure 8 sutures of #1 Chromic, hemostasis achieved.  The uterosacral ligaments were then plicated in the midline with 2-0 silk after the Bonnano speculum was removed and a shorter vaginal speculum was placed. The previously tagged uterosacral pedicles were also tied in the midline.    Attention was turned to A&P repair.  The vaginal cuff was grasped with 2 Allis clamps. The small cystocele was then mobilized by dissecting anteriorly in the midline from the vaginal cuff to within about 2 cm of the urethral meatus in the midline. Cystocele was mobilized laterally. Cystocele was then reduced with 3 interrupted sutures of 2-0 Vicryl with adequate reduction. A small amount of excess vaginal tissue was removed and the incision was closed with running locking 2-0 Vicryl with adequate closure and adequate hemostasis.  Attention was now turned to the rectocele repair. The hymenal ring was grasped with 2 Allis clamps at a distance that when brought together would still allow two fingers to  easily pass into the vagina. A horizontal piece of tissue was removed. The posterior vaginal mucosa was then dissected in the midline from the hymenal ring to the vaginal apex. Rectocele was then mobilized bilaterally sharply and bluntly. The rectocele was then reduced with interrupted sutures of 2-0 Vicryl. This achieved good reduction. A rectal exam confirmed good reduction of the rectocele. Excess vaginal mucosa was then removed sharply. Vagina was closed from the vaginal apex progressing distally with running locking 2-0 Vicryl.  This achieved adequate closure and adequate hemostasis.    Attention was turned to cystoscopy.  A 70 cystoscope was inserted and 200 cc of fluid was instilled. The bladder appeared normal. Both ureteral orifices were  easily identified and urine was seen to  flow freely from each orifice. The cystoscope was removed.  The vagina was then packed with 2 inch gauze coated with gel. A Foley catheter was also placed. The patient tolerated the procedure well. She was awakened and taken to the recovery in stable condition. Counts were correct, she had PAS hose on throughout the procedure. She received Ancef 2 g for surgical prophylaxis.

## 2021-08-10 NOTE — Interval H&P Note (Signed)
History and Physical Interval Note:  08/10/2021 7:15 AM  [name]  has presented today for surgery, with the diagnosis of stress incontinence, fibroids, rectocele.  The various methods of treatment have been discussed with the patient and family. After consideration of risks, benefits and other options for treatment, the patient has consented to  Procedure(s): HYSTERECTOMY VAGINAL POSSBILE BILATERAL SALPINGECTOMY (Bilateral) POSTERIOR REPAIR (RECTOCELE), POSSIBLE POSTERIOR REPAIR (N/A) TRANSVAGINAL TAPE (TVT) PROCEDURE (N/A) as a surgical intervention-will NOT be doing sling, she has decided to wait for that..  The patient's history has been reviewed, patient examined, no change in status, stable for surgery.  I have reviewed the patient's chart and labs.  Questions were answered to the patient's satisfaction.     [name]

## 2021-08-10 NOTE — Transfer of Care (Signed)
Immediate Anesthesia Transfer of Care Note  Patient: Staphany Ditton  Procedure(s) Performed: HYSTERECTOMY VAGINAL WITH BILATERAL SALPINGECTOMY (Bilateral: Uterus) POSTERIOR REPAIR (RECTOCELE), ANTERIOR REPAIR AND CYSTOSCOPY (Abdomen)  Patient Location: PACU  Anesthesia Type:General  Level of Consciousness: awake, alert  and oriented  Airway & Oxygen Therapy: Patient Spontanous Breathing and Patient connected to nasal cannula oxygen  Post-op Assessment: Report given to RN and Post -op Vital signs reviewed and stable  Post vital signs: Reviewed and stable  Last Vitals:  Vitals Value Taken Time  BP 119/79 08/10/21 0952  Temp    Pulse 94 08/10/21 0955  Resp 16 08/10/21 0955  SpO2 96 % 08/10/21 0955  Vitals shown include unvalidated device data.  Last Pain:  Vitals:   08/10/21 0606  TempSrc: Oral  PainSc: 0-No pain      Patients Stated Pain Goal: 6 (20/10/07 1219)  Complications: No notable events documented.

## 2021-08-10 NOTE — Anesthesia Procedure Notes (Signed)
Procedure Name: Intubation Date/Time: 08/10/2021 7:37 AM Performed by: Monita Swier D, CRNA Pre-anesthesia Checklist: Patient identified, Emergency Drugs available, Suction available and Patient being monitored Patient Re-evaluated:Patient Re-evaluated prior to induction Oxygen Delivery Method: Circle system utilized Preoxygenation: Pre-oxygenation with 100% oxygen Induction Type: IV induction Ventilation: Mask ventilation without difficulty Laryngoscope Size: Mac and 3 Grade View: Grade I Tube type: Oral Tube size: 7.0 mm Number of attempts: 1 Airway Equipment and Method: Stylet Placement Confirmation: ETT inserted through vocal cords under direct vision, positive ETCO2 and breath sounds checked- equal and bilateral Secured at: 21 cm Tube secured with: Tape Dental Injury: Teeth and Oropharynx as per pre-operative assessment

## 2021-08-10 NOTE — Anesthesia Postprocedure Evaluation (Signed)
Anesthesia Post Note  Patient: [name]  Procedure(s) Performed: HYSTERECTOMY VAGINAL WITH BILATERAL SALPINGECTOMY (Bilateral: Uterus) POSTERIOR REPAIR (RECTOCELE), ANTERIOR REPAIR AND CYSTOSCOPY (Abdomen)     Patient location during evaluation: PACU Anesthesia Type: General Level of consciousness: awake Pain management: pain level controlled Vital Signs Assessment: post-procedure vital signs reviewed and stable Respiratory status: spontaneous breathing and respiratory function stable Cardiovascular status: stable Postop Assessment: no apparent nausea or vomiting Anesthetic complications: no   No notable events documented.  Last Vitals:  Vitals:   08/10/21 1100 08/10/21 1120  BP: 111/68 121/65  Pulse: 74 72  Resp: 16 16  Temp: 36.8 C 36.7 C  SpO2: 96% 93%    Last Pain:  Vitals:   08/10/21 1106  TempSrc:   PainSc: 7                  Candra R Allee Busk

## 2021-08-10 NOTE — Progress Notes (Signed)
Post-op check, TVH, A&P repair Doing ok, pain currently controlled, no current n/v, has ambulated, tol some PO Afeb, VSS Adequate clear urine in foley Abd- soft, sl tender RLQ  Doing well post-op, continue routine care I will remove packing and foley in am and hopefully d/c home

## 2021-08-11 ENCOUNTER — Encounter (HOSPITAL_BASED_OUTPATIENT_CLINIC_OR_DEPARTMENT_OTHER): Payer: Self-pay | Admitting: Obstetrics and Gynecology

## 2021-08-11 DIAGNOSIS — D259 Leiomyoma of uterus, unspecified: Secondary | ICD-10-CM | POA: Diagnosis not present

## 2021-08-11 LAB — CBC
HCT: 37.7 % (ref 36.0–46.0)
Hemoglobin: 12.4 g/dL (ref 12.0–15.0)
MCH: 30.8 pg (ref 26.0–34.0)
MCHC: 32.9 g/dL (ref 30.0–36.0)
MCV: 93.8 fL (ref 80.0–100.0)
Platelets: 276 10*3/uL (ref 150–400)
RBC: 4.02 MIL/uL (ref 3.87–5.11)
RDW: 12 % (ref 11.5–15.5)
WBC: 15.1 10*3/uL — ABNORMAL HIGH (ref 4.0–10.5)
nRBC: 0 % (ref 0.0–0.2)

## 2021-08-11 LAB — SURGICAL PATHOLOGY

## 2021-08-11 MED ORDER — OXYCODONE HCL 5 MG PO TABS
ORAL_TABLET | ORAL | Status: AC
  Filled 2021-08-11: qty 2

## 2021-08-11 MED ORDER — OXYCODONE HCL 5 MG PO TABS: 5.0000 mg | ORAL_TABLET | ORAL | 0 refills | Status: AC | PRN

## 2021-08-11 MED ORDER — KETOROLAC TROMETHAMINE 30 MG/ML IJ SOLN
INTRAMUSCULAR | Status: AC
  Filled 2021-08-11: qty 1

## 2021-08-11 MED ORDER — IBUPROFEN 600 MG PO TABS: 600.0000 mg | ORAL_TABLET | Freq: Four times a day (QID) | ORAL | 0 refills | Status: AC

## 2021-08-11 MED ORDER — ACETAMINOPHEN 500 MG PO TABS
ORAL_TABLET | ORAL | Status: AC
  Filled 2021-08-11: qty 2

## 2021-08-11 MED ORDER — GABAPENTIN 300 MG PO CAPS: 300.0000 mg | ORAL_CAPSULE | Freq: Three times a day (TID) | ORAL | 0 refills | Status: AC

## 2021-08-11 NOTE — Progress Notes (Signed)
1 Day Post-Op Procedure(s) (LRB): HYSTERECTOMY VAGINAL WITH BILATERAL SALPINGECTOMY (Bilateral) POSTERIOR REPAIR (RECTOCELE), ANTERIOR REPAIR AND CYSTOSCOPY (N/A)  Subjective: Patient reports incisional pain and tolerating PO.    Objective: I have reviewed patient's vital signs, intake and output, and labs.  General: alert GI: soft, NT Vaginal Bleeding: minimal, packing and foley removed  Assessment: s/p Procedure(s): HYSTERECTOMY VAGINAL WITH BILATERAL SALPINGECTOMY (Bilateral) POSTERIOR REPAIR (RECTOCELE), ANTERIOR REPAIR AND CYSTOSCOPY (N/A): stable, progressing well, and tolerating diet  Plan: Encourage ambulation Discontinue IV fluids Discharge home  LOS: 0 days    [name] 08/11/2021, 7:34 AM
